# Patient Record
Sex: Male | Born: 2005 | Race: White | Hispanic: No | Marital: Single
Health system: Southern US, Community
[De-identification: ages and names within clinical notes are randomized; demographics above are authoritative.]

## PROBLEM LIST (undated history)

## (undated) DIAGNOSIS — Z789 Other specified health status: Secondary | ICD-10-CM

## (undated) HISTORY — PX: TYMPANOSTOMY TUBE PLACEMENT: SHX32

---
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
## 2024-03-16 ENCOUNTER — Emergency Department
Admission: EM | Admit: 2024-03-16 | Discharge: 2024-03-16 | Disposition: A | Discharge status: Home / Self Care | Payer: Self-pay | Attending: Emergency Medicine | Admitting: Emergency Medicine

## 2024-03-16 ENCOUNTER — Other Ambulatory Visit: Payer: Self-pay

## 2024-03-16 DIAGNOSIS — F1090 Alcohol use, unspecified, uncomplicated: Secondary | ICD-10-CM | POA: Diagnosis not present

## 2024-03-16 DIAGNOSIS — R1013 Epigastric pain: Secondary | ICD-10-CM | POA: Insufficient documentation

## 2024-03-16 LAB — COMPREHENSIVE METABOLIC PANEL WITH GFR
ALT: 25 U/L (ref 0–44)
AST: 24 U/L (ref 15–41)
Albumin: 4.5 g/dL (ref 3.5–5.0)
Alkaline Phosphatase: 108 U/L (ref 38–126)
Anion gap: 8 (ref 5–15)
BUN: 13 mg/dL (ref 6–20)
CO2: 25 mmol/L (ref 22–32)
Calcium: 9.5 mg/dL (ref 8.9–10.3)
Chloride: 105 mmol/L (ref 98–111)
Creatinine, Ser: 0.85 mg/dL (ref 0.61–1.24)
GFR, Estimated: >60 mL/min (ref >60)
Glucose, Bld: 84 mg/dL (ref 70–99)
Potassium: 3.6 mmol/L (ref 3.5–5.1)
Sodium: 138 mmol/L (ref 135–145)
Total Bilirubin: 0.7 mg/dL (ref 0.0–1.2)
Total Protein: 7.5 g/dL (ref 6.5–8.1)

## 2024-03-16 LAB — CBC
HCT: 42.9 % (ref 39.0–52.0)
Hemoglobin: 15.2 g/dL (ref 13.0–17.0)
MCH: 30.5 pg (ref 26.0–34.0)
MCHC: 35.4 g/dL (ref 30.0–36.0)
MCV: 86 fL (ref 80.0–100.0)
Platelets: 321 10*3/uL (ref 150–400)
RBC: 4.99 MIL/uL (ref 4.22–5.81)
RDW: 12.1 % (ref 11.5–15.5)
WBC: 9.9 10*3/uL (ref 4.0–10.5)
nRBC: 0 % (ref 0.0–0.2)

## 2024-03-16 MED ORDER — SUCRALFATE 1 G PO TABS: 1.0000 g | ORAL_TABLET | Freq: Four times a day (QID) | ORAL | 1 refills | Status: DC

## 2024-03-16 MED ORDER — PANTOPRAZOLE SODIUM 40 MG PO TBEC: 40.0000 mg | DELAYED_RELEASE_TABLET | Freq: Every day | ORAL | 1 refills | Status: DC

## 2024-03-16 NOTE — Discharge Instructions (Addendum)
 Please follow-up with GI.  Their information is attached.  I believe that your abdominal pain is coming from gastritis or an ulcer.    Please begin taking the Protonix once a day in the morning, this medication will help reduce acid over time.  You can take the Carafate before each meal and at bedtime.  This medication will help neutralize acid right when you take it.

## 2024-03-16 NOTE — ED Triage Notes (Signed)
 Brought from [facility]. C/o abdominal pain with black/coffee ground stools X3 days. Denies pain at this time.

## 2024-03-16 NOTE — ED Notes (Signed)
 Pt verbalizes understanding of discharge instructions. Opportunity for questioning and answers were provided. Pt discharged from ED to home with family.

## 2024-03-16 NOTE — ED Triage Notes (Signed)
 Patient states intermittent abdominal pain and dark stools

## 2024-03-16 NOTE — ED Provider Notes (Signed)
 [facility] Provider Note    Event Date/Time   First MD Initiated Contact with Patient 03/16/24 1543     (approximate)   History   GI Problem   HPI  Albert Jimenez is a 18 y.o. male with no PMH who presents for evaluation of dark tarry colored stools for 3 days.  He has had intermittent abdominal pain in the epigastric region.  Has not coughed up or vomited any blood.  No NSAID, aspirin or iron supplement use.  Minimal alcohol use.  No recent vomiting or coughing episodes.      Physical Exam   Triage Vital Signs: ED Triage Vitals  Encounter Vitals Group     BP 03/16/24 1341 134/79     Girls Systolic BP Percentile --      Girls Diastolic BP Percentile --      Boys Systolic BP Percentile --      Boys Diastolic BP Percentile --      Pulse Rate 03/16/24 1341 79     Resp 03/16/24 1341 18     Temp 03/16/24 1341 98.6 F (37 C)     Temp Source 03/16/24 1341 Oral     SpO2 03/16/24 1341 98 %     Weight 03/16/24 1342 190 lb (86.2 kg)     Height 03/16/24 1342 6' 2 (1.88 m)     Head Circumference --      Peak Flow --      Pain Score 03/16/24 1342 0     Pain Loc --      Pain Education --      Exclude from Growth Chart --     Most recent vital signs: Vitals:   03/16/24 1341  BP: 134/79  Pulse: 79  Resp: 18  Temp: 98.6 F (37 C)  SpO2: 98%   General: Awake, no distress.  CV:  Good peripheral perfusion.  RRR. Resp:  Normal effort.  CTAB Abd:  No distention.  Soft, no tenderness to palpation Other:  No pallor, jaundice petechiae or purpura.   ED Results / Procedures / Treatments   Labs (all labs ordered are listed, but only abnormal results are displayed) Labs Reviewed  COMPREHENSIVE METABOLIC PANEL WITH GFR  CBC  POC OCCULT BLOOD, ED    PROCEDURES:  Critical Care performed: No  Procedures   MEDICATIONS ORDERED IN ED: Medications - No data to display   IMPRESSION / MDM / ASSESSMENT AND PLAN / ED COURSE  I reviewed the  triage vital signs and the nursing notes.                             18 year old male presents for evaluation of melena.  Vital signs are stable patient NAD on exam.  Differential diagnosis includes, but is not limited to, upper GI bleed due to gastritis or peptic ulcer, less likely Mallory-Weiss tear, esophageal varices.  Patient's presentation is most consistent with acute complicated illness / injury requiring diagnostic workup.  CBC and CMP both unremarkable.  H&H is stable.  Discussed doing a rectal exam with patient to check for Hemoccult.  Patient declined rectal exam but was able to give a stool sample.  Stool was not melena.  Hemoccult was positive.  Explained to patient's family and to the patient that this test commonly has false positives.  Given that his H&H is stable, he did not have any tenderness on exam, he is overall well-appearing  do not feel that he needed admission to the hospital today.  Recommended that he follow-up with GI for further evaluation of his abdominal pain.  Will start him on a PPI and will also prescribe an antiacid.  Patient voiced understanding, all questions were answered and he was stable at discharge.      FINAL CLINICAL IMPRESSION(S) / ED DIAGNOSES   Final diagnoses:  Epigastric pain     Rx / DC Orders   ED Discharge Orders          Ordered    pantoprazole (PROTONIX) 40 MG tablet  Daily        03/16/24 1652    sucralfate (CARAFATE) 1 g tablet  4 times daily        03/16/24 1652             Note:  This document was prepared using Dragon voice recognition software and may include unintentional dictation errors.   Phyliss Breen, PA-C 03/16/24 1656    Lubertha Rush, MD 03/16/24 [phone]

## 2024-09-18 ENCOUNTER — Other Ambulatory Visit: Payer: Self-pay

## 2024-09-18 ENCOUNTER — Emergency Department

## 2024-09-18 ENCOUNTER — Inpatient Hospital Stay
Admission: EM | Admit: 2024-09-18 | Discharge: 2024-09-24 | Disposition: A | Discharge status: Home / Self Care | Attending: Internal Medicine | Admitting: Internal Medicine

## 2024-09-18 DIAGNOSIS — X509XXA Other and unspecified overexertion or strenuous movements or postures, initial encounter: Secondary | ICD-10-CM

## 2024-09-18 DIAGNOSIS — Z79899 Other long term (current) drug therapy: Secondary | ICD-10-CM

## 2024-09-18 DIAGNOSIS — R52 Pain, unspecified: Secondary | ICD-10-CM

## 2024-09-18 DIAGNOSIS — E876 Hypokalemia: Secondary | ICD-10-CM | POA: Diagnosis present

## 2024-09-18 DIAGNOSIS — M25511 Pain in right shoulder: Principal | ICD-10-CM | POA: Diagnosis present

## 2024-09-18 DIAGNOSIS — R7401 Elevation of levels of liver transaminase levels: Secondary | ICD-10-CM | POA: Diagnosis present

## 2024-09-18 DIAGNOSIS — R748 Abnormal levels of other serum enzymes: Secondary | ICD-10-CM | POA: Diagnosis present

## 2024-09-18 DIAGNOSIS — M75111 Incomplete rotator cuff tear or rupture of right shoulder, not specified as traumatic: Secondary | ICD-10-CM | POA: Diagnosis present

## 2024-09-18 DIAGNOSIS — F17290 Nicotine dependence, other tobacco product, uncomplicated: Secondary | ICD-10-CM | POA: Diagnosis present

## 2024-09-18 DIAGNOSIS — T796XXA Traumatic ischemia of muscle, initial encounter: Principal | ICD-10-CM | POA: Diagnosis present

## 2024-09-18 DIAGNOSIS — M6282 Rhabdomyolysis: Secondary | ICD-10-CM | POA: Insufficient documentation

## 2024-09-18 DIAGNOSIS — R7989 Other specified abnormal findings of blood chemistry: Secondary | ICD-10-CM | POA: Diagnosis present

## 2024-09-18 LAB — BASIC METABOLIC PANEL WITH GFR
Anion gap: 10 (ref 5–15)
BUN: 17 mg/dL (ref 6–20)
CO2: 23 mmol/L (ref 22–32)
Calcium: 9.1 mg/dL (ref 8.9–10.3)
Chloride: 103 mmol/L (ref 98–111)
Creatinine, Ser: 0.69 mg/dL (ref 0.61–1.24)
GFR, Estimated: >60 mL/min
Glucose, Bld: 95 mg/dL (ref 70–99)
Potassium: 3.8 mmol/L (ref 3.5–5.1)
Sodium: 136 mmol/L (ref 135–145)

## 2024-09-18 LAB — URINALYSIS, ROUTINE W REFLEX MICROSCOPIC
Bilirubin Urine: NEGATIVE
Glucose, UA: NEGATIVE mg/dL
Hgb urine dipstick: NEGATIVE
Ketones, ur: NEGATIVE mg/dL
Leukocytes,Ua: NEGATIVE
Nitrite: NEGATIVE
Protein, ur: NEGATIVE mg/dL
Specific Gravity, Urine: 1.017 (ref 1.005–1.030)
pH: 6 (ref 5.0–8.0)

## 2024-09-18 LAB — CBC WITH DIFFERENTIAL/PLATELET
Abs Immature Granulocytes: 0.04 K/uL (ref 0.00–0.07)
Basophils Absolute: 0 K/uL (ref 0.0–0.1)
Basophils Relative: 0 %
Eosinophils Absolute: 0 K/uL (ref 0.0–0.5)
Eosinophils Relative: 0 %
HCT: 42.7 % (ref 39.0–52.0)
Hemoglobin: 14.9 g/dL (ref 13.0–17.0)
Immature Granulocytes: 0 %
Lymphocytes Relative: 10 %
Lymphs Abs: 1.1 K/uL (ref 0.7–4.0)
MCH: 29.9 pg (ref 26.0–34.0)
MCHC: 34.9 g/dL (ref 30.0–36.0)
MCV: 85.7 fL (ref 80.0–100.0)
Monocytes Absolute: 0.6 K/uL (ref 0.1–1.0)
Monocytes Relative: 6 %
Neutro Abs: 8.6 K/uL — ABNORMAL HIGH (ref 1.7–7.7)
Neutrophils Relative %: 84 %
Platelets: 259 K/uL (ref 150–400)
RBC: 4.98 MIL/uL (ref 4.22–5.81)
RDW: 12.1 % (ref 11.5–15.5)
WBC: 10.4 K/uL (ref 4.0–10.5)
nRBC: 0 % (ref 0.0–0.2)

## 2024-09-18 LAB — MAGNESIUM: Magnesium: 2 mg/dL (ref 1.7–2.4)

## 2024-09-18 LAB — CK
Total CK: 6339 U/L — ABNORMAL HIGH (ref 49–397)
Total CK: 6047 U/L — ABNORMAL HIGH (ref 49–397)

## 2024-09-18 MED ORDER — HYDROMORPHONE HCL 1 MG/ML IJ SOLN
1.0000 mg | Freq: Once | INTRAMUSCULAR | Status: AC
  Administered 2024-09-18: 1 mg via INTRAVENOUS
  Filled 2024-09-18: qty 1

## 2024-09-18 MED ORDER — ACETAMINOPHEN 325 MG PO TABS
650.0000 mg | ORAL_TABLET | Freq: Once | ORAL | Status: AC
  Administered 2024-09-18: 650 mg via ORAL
  Filled 2024-09-18: qty 2

## 2024-09-18 MED ORDER — KETOROLAC TROMETHAMINE 15 MG/ML IJ SOLN
15.0000 mg | Freq: Once | INTRAMUSCULAR | Status: AC
  Administered 2024-09-18: 15 mg via INTRAMUSCULAR
  Filled 2024-09-18: qty 1

## 2024-09-18 MED ORDER — SENNOSIDES-DOCUSATE SODIUM 8.6-50 MG PO TABS: 1.0000 | ORAL_TABLET | Freq: Every evening | ORAL | Status: DC | PRN

## 2024-09-18 MED ORDER — ACETAMINOPHEN 325 MG PO TABS: 650.0000 mg | ORAL_TABLET | Freq: Four times a day (QID) | ORAL | Status: DC | PRN

## 2024-09-18 MED ORDER — MORPHINE SULFATE (PF) 2 MG/ML IV SOLN
2.0000 mg | Freq: Once | INTRAVENOUS | Status: AC
  Administered 2024-09-18: 2 mg via INTRAVENOUS
  Filled 2024-09-18: qty 1

## 2024-09-18 MED ORDER — HYDROMORPHONE HCL 1 MG/ML IJ SOLN: 1.0000 mg | INTRAMUSCULAR | Status: DC | PRN

## 2024-09-18 MED ORDER — ACETAMINOPHEN 500 MG PO TABS: 1000.0000 mg | ORAL_TABLET | Freq: Four times a day (QID) | ORAL | Status: DC | PRN

## 2024-09-18 MED ORDER — METHOCARBAMOL 500 MG PO TABS
500.0000 mg | ORAL_TABLET | Freq: Four times a day (QID) | ORAL | Status: DC
  Administered 2024-09-18 – 2024-09-21 (×14): 500 mg via ORAL
  Filled 2024-09-18 (×17): qty 1

## 2024-09-18 MED ORDER — SODIUM CHLORIDE 0.9 % IV BOLUS
1000.0000 mL | Freq: Once | INTRAVENOUS | Status: AC
  Administered 2024-09-18: 1000 mL via INTRAVENOUS

## 2024-09-18 MED ORDER — ACETAMINOPHEN 500 MG PO TABS
1000.0000 mg | ORAL_TABLET | Freq: Four times a day (QID) | ORAL | Status: DC
  Administered 2024-09-18 – 2024-09-23 (×14): 1000 mg via ORAL
  Filled 2024-09-18 (×17): qty 2

## 2024-09-18 MED ORDER — SODIUM CHLORIDE 0.9% FLUSH
3.0000 mL | Freq: Two times a day (BID) | INTRAVENOUS | Status: DC
  Administered 2024-09-18 – 2024-09-23 (×7): 3 mL via INTRAVENOUS

## 2024-09-18 MED ORDER — ACETAMINOPHEN 325 MG RE SUPP: 650.0000 mg | Freq: Four times a day (QID) | RECTAL | Status: DC | PRN

## 2024-09-18 MED ORDER — HEPARIN SODIUM (PORCINE) 5000 UNIT/ML IJ SOLN
5000.0000 [IU] | Freq: Three times a day (TID) | INTRAMUSCULAR | Status: DC
  Administered 2024-09-18 – 2024-09-22 (×11): 5000 [IU] via SUBCUTANEOUS
  Filled 2024-09-18 (×11): qty 1

## 2024-09-18 MED ORDER — GADOBUTROL 1 MMOL/ML IV SOLN
9.0000 mL | Freq: Once | INTRAVENOUS | Status: AC | PRN
  Administered 2024-09-18: 9 mL via INTRAVENOUS

## 2024-09-18 MED ORDER — KETOROLAC TROMETHAMINE 15 MG/ML IJ SOLN
15.0000 mg | Freq: Four times a day (QID) | INTRAMUSCULAR | Status: DC
  Administered 2024-09-18 – 2024-09-20 (×7): 15 mg via INTRAVENOUS
  Filled 2024-09-18 (×7): qty 1

## 2024-09-18 MED ORDER — LACTATED RINGERS IV BOLUS
1000.0000 mL | Freq: Once | INTRAVENOUS | Status: AC
  Administered 2024-09-18: 1000 mL via INTRAVENOUS

## 2024-09-18 MED ORDER — ONDANSETRON HCL 4 MG PO TABS: 4.0000 mg | ORAL_TABLET | Freq: Four times a day (QID) | ORAL | Status: DC | PRN

## 2024-09-18 MED ORDER — LACTATED RINGERS IV SOLN: INTRAVENOUS | Status: DC

## 2024-09-18 MED ORDER — ONDANSETRON HCL 4 MG/2ML IJ SOLN: 4.0000 mg | Freq: Four times a day (QID) | INTRAMUSCULAR | Status: DC | PRN

## 2024-09-18 MED ORDER — OXYCODONE HCL 5 MG PO TABS
5.0000 mg | ORAL_TABLET | ORAL | Status: DC | PRN
  Administered 2024-09-18: 5 mg via ORAL
  Filled 2024-09-18: qty 1

## 2024-09-18 NOTE — ED Provider Notes (Signed)
 "  [facility] Provider Note    Event Date/Time   First MD Initiated Contact with Patient 09/18/24 0825     (approximate)   History   Shoulder Injury   HPI  Albert Jimenez is a 18 y.o. male who presents today for evaluation of right shoulder pain.  Patient reports that he recently started working out again and was lifting heavy weights last night.  He felt a sharp pull in his right shoulder at the time but did not think too much of it and continued throughout the rest of his evening.  He reports that the pain awoke him from sleep at approximately 3 AM.  He reports that he has pain with any movement of the shoulder now.  He reports that the pain is primarily on the lateral aspect of his shoulder.  Does not radiate down to his fingers or into his chest or neck.  No fevers or chills.  Denies any dark urine.  There are no active problems to display for this patient.         Physical Exam   Triage Vital Signs: ED Triage Vitals  Encounter Vitals Group     BP 09/18/24 0744 120/64     Girls Systolic BP Percentile --      Girls Diastolic BP Percentile --      Boys Systolic BP Percentile --      Boys Diastolic BP Percentile --      Pulse Rate 09/18/24 0744 70     Resp 09/18/24 0744 20     Temp 09/18/24 0744 98.1 F (36.7 C)     Temp Source 09/18/24 0744 Oral     SpO2 09/18/24 0744 98 %     Weight 09/18/24 0743 200 lb (90.7 kg)     Height 09/18/24 0743 6' 2 (1.88 m)     Head Circumference --      Peak Flow --      Pain Score 09/18/24 0742 10     Pain Loc --      Pain Education --      Exclude from Growth Chart --     Most recent vital signs: Vitals:   09/18/24 0744 09/18/24 1239  BP: 120/64 (!) 124/58  Pulse: 70 68  Resp: 20 17  Temp: 98.1 F (36.7 C) 98 F (36.7 C)  SpO2: 98% 99%    Physical Exam Vitals and nursing note reviewed.  Constitutional:      General: Awake and alert. No acute distress.    Appearance: Normal appearance. The  patient is normal weight.  HENT:     Head: Normocephalic and atraumatic.     Mouth: Mucous membranes are moist.  Eyes:     General: PERRL. Normal EOMs        Right eye: No discharge.        Left eye: No discharge.     Conjunctiva/sclera: Conjunctivae normal.  Cardiovascular:     Rate and Rhythm: Normal rate and regular rhythm.     Pulses: Normal pulses.  Pulmonary:     Effort: Pulmonary effort is normal. No respiratory distress.     Breath sounds: Normal breath sounds.  Abdominal:     Abdomen is soft. There is no abdominal tenderness. No rebound or guarding. No distention. Musculoskeletal:        General: No swelling. Normal range of motion.     Cervical back: Normal range of motion and neck supple.  No cervical spine  tenderness. Right shoulder: No obvious deformity, swelling, ecchymosis, or erythema.  No warmth or wounds noted. No clavicular or AC joint tenderness.  Tenderness to lateral shoulder joint line.  No ecchymosis noted. Limited range of motion with active and passive range of motion at the shoulder secondary to pain, though able to range it to approximately 20 degrees with abduction and forward flexion, negative drop arm test Pain with attempted Obriens, SLAP, empty can, and lift off tests Normal ROM at elbow and wrist without tenderness to palpation 2+ radial pulse Normal grip strength Normal intrinsic hand muscle function Skin:    General: Skin is warm and dry.     Capillary Refill: Capillary refill takes less than 2 seconds.     Findings: No rash.  Neurological:     Mental Status: The patient is awake and alert.      ED Results / Procedures / Treatments   Labs (all labs ordered are listed, but only abnormal results are displayed) Labs Reviewed  CBC WITH DIFFERENTIAL/PLATELET - Abnormal; Notable for the following components:      Result Value   Neutro Abs 8.6 (*)    All other components within normal limits  CK - Abnormal; Notable for the following  components:   Total CK 6,339 (*)    All other components within normal limits  CK - Abnormal; Notable for the following components:   Total CK 6,047 (*)    All other components within normal limits  BASIC METABOLIC PANEL WITH GFR     EKG     RADIOLOGY I independently reviewed and interpreted imaging and agree with radiologists findings.     PROCEDURES:  Critical Care performed:   Procedures   MEDICATIONS ORDERED IN ED: Medications  morphine  (PF) 2 MG/ML injection 2 mg (has no administration in time range)  ketorolac  (TORADOL ) 15 MG/ML injection 15 mg (15 mg Intramuscular Given 09/18/24 0957)  sodium chloride  0.9 % bolus 1,000 mL (0 mLs Intravenous Stopped 09/18/24 1130)  sodium chloride  0.9 % bolus 1,000 mL (0 mLs Intravenous Stopped 09/18/24 1300)  acetaminophen  (TYLENOL ) tablet 650 mg (650 mg Oral Given 09/18/24 1242)  morphine  (PF) 2 MG/ML injection 2 mg (2 mg Intravenous Given 09/18/24 1242)  morphine  (PF) 2 MG/ML injection 2 mg (2 mg Intravenous Given 09/18/24 1340)     IMPRESSION / MDM / ASSESSMENT AND PLAN / ED COURSE  I reviewed the triage vital signs and the nursing notes.   Differential diagnosis includes, but is not limited to, musculoskeletal injury, fracture, dislocation, rhabdomyolysis  Patient is awake and alert, hemodynamically stable and afebrile.  X-ray obtained in triage is negative for any acute osseous injury.  There is no obvious deformity on exam and he has normal radial pulse, normal grip strength.  Recommended further workup with blood testing for evaluation of rhabdomyolysis.  CK was elevated to 6339.  No creatinine elevation. He was hydrated with normal saline and plan to recheck this.  In the meantime pain control with Toradol  with good effect  CK is downtrending.  Patient was reassessed multiple times, and his pain appears to be worsening.  He was treated symptomatically with morphine  with good effect.  He has normal radial pulse,  sensation is intact over the deltoid.  He is neurovascularly intact.  Compartments are soft and compressible throughout.  There is no warmth or erythema or constitutional symptoms to suggest septic shoulder.  However, his pain does seem to be out of proportion to the story, therefore we will  obtain MRI for further evaluation.  Family is in agreement with this plan as this patient.  Patient was passed off to Devere Perry, PA-C, pending MRI results and final disposition.  Patient's presentation is most consistent with acute presentation with potential threat to life or bodily function.  Clinical Course as of 09/18/24 1513  Sun Sep 18, 2024  1235 Patient is requesting pain medication [JP]  1308 Went to reassess patient, patient reports that approximately 20 minutes ago his pain became much more severe.  He was given morphine  which he reports has barely touched him.  He is holding his arm tightly against his chest.  Has pain with any attempted movement of his shoulder.  Will obtain MRI [JP]  1440 Patient reports that he feels better though pain still persists [JP]  1504 Patient passed off to S. Perry, PA-C pending MRI and final disposition [JP]    Clinical Course User Index [JP] Deandra Goering E, PA-C     FINAL CLINICAL IMPRESSION(S) / ED DIAGNOSES   Final diagnoses:  Acute pain of right shoulder  Elevated CK     Rx / DC Orders   ED Discharge Orders     None        Note:  This document was prepared using Dragon voice recognition software and may include unintentional dictation errors.   Deny Chevez E, PA-C 09/18/24 1513  "

## 2024-09-18 NOTE — H&P (Addendum)
 " History and Physical    BRODRICK CURRAN FMW:[id number] DOB: 2006-02-18 DOA: 09/18/2024  DOS: the patient was seen and examined on 09/18/2024  PCP: Practice, [facility]   Patient coming from: Home  I have personally briefly reviewed patient's old medical records in [facility] and [facility]where  HPI:   Albert Jimenez is a 18 y.o. year old male without any significant medical history presenting to the ED with right shoulder pain.  He states he was working out yesterday and had overextension when he was performing chest flies.  He initially did not have any significant pain but woke up in pain at 3 AM in the morning.  Describes the pain as sharp, severe.  States he has significant pain with any movement in the right arm.  He states range of motion is limited by pain.  He denies any fevers or chills any URI or other infectious symptoms.   On arrival to the ED patient was noted to be HDS stable.  Lab work and imaging obtained.  CBC without leukocytosis or anemia.  BMP unremarkable.  CK elevated at 6339 and patient received IV fluid and repeat CK downtrending close 6047.  UA pending.  Patient with significant shoulder pain, given this and elevated CK, TRH contacted for admission.  Review of Systems: As mentioned in the history of present illness. All other systems reviewed and are negative.   Past Medical History:  Diagnosis Date   Medical history non-contributory     Past Surgical History:  Procedure Laterality Date   CLOSED REDUCTION WRIST FRACTURE Left 07/02/2016   Procedure: CLOSED REDUCTION LEFT FOREARM;  Surgeon: Norleen JINNY Maltos, MD;  [facility];  Service: Orthopedics;  Laterality: Left;   TYMPANOSTOMY TUBE PLACEMENT     as young child     Allergies[1]  History reviewed. No pertinent family history.  Medications: Patient not taking any medicines.  Social History:  reports that he is a non-smoker but has been exposed to tobacco smoke. He has  never used smokeless tobacco. He reports that he does not drink alcohol and does not use drugs.  Patient works for [employer].  Vapes and drinks alcohol on special occasions.  Independent in IADLs and ADLs  Physical Exam: Vitals:   09/18/24 0743 09/18/24 0744 09/18/24 1239  BP:  120/64 (!) 124/58  Pulse:  70 68  Resp:  20 17  Temp:  98.1 F (36.7 C) 98 F (36.7 C)  TempSrc:  Oral Oral  SpO2:  98% 99%  Weight: 90.7 kg    Height: 6' 2 (1.88 m)      Gen: NAD HENT: NCAT CV: normal heart sounds Lung: CTAB Abd: No TTP, normal bowel sounds MSK: No asymmetry, good bulk and tone.  Patient deferred right shoulder testing due to pain.  Right upper extremity is neurovascularly intact. Neuro: alert and oriented   Labs on Admission: I have personally reviewed following labs and imaging studies  CBC: Recent Labs  Lab 09/18/24 0921  WBC 10.4  NEUTROABS 8.6*  HGB 14.9  HCT 42.7  MCV 85.7  PLT 259   Basic Metabolic Panel: Recent Labs  Lab 09/18/24 0921  NA 136  K 3.8  CL 103  CO2 23  GLUCOSE 95  BUN 17  CREATININE 0.69  CALCIUM 9.1   GFR: Estimated Creatinine Clearance: 171.3 mL/min (by C-G formula based on SCr of 0.69 mg/dL). Liver Function Tests: No results for input(s): AST, ALT, ALKPHOS, BILITOT, PROT, ALBUMIN in  the last 168 hours. No results for input(s): LIPASE, AMYLASE in the last 168 hours. No results for input(s): AMMONIA in the last 168 hours. Coagulation Profile: No results for input(s): INR, PROTIME in the last 168 hours. Cardiac Enzymes: Recent Labs  Lab 09/18/24 0921 09/18/24 1305  CKTOTAL 6,339* 6,047*   BNP (last 3 results) No results for input(s): BNP in the last 8760 hours. HbA1C: No results for input(s): HGBA1C in the last 72 hours. CBG: No results for input(s): GLUCAP in the last 168 hours. Lipid Profile: No results for input(s): CHOL, HDL, LDLCALC, TRIG, CHOLHDL, LDLDIRECT in the last 72 hours. Thyroid  Function Tests: No results for input(s): TSH, T4TOTAL, FREET4, T3FREE, THYROIDAB in the last 72 hours. Anemia Panel: No results for input(s): VITAMINB12, FOLATE, FERRITIN, TIBC, IRON, RETICCTPCT in the last 72 hours. Urine analysis: No results found for: COLORURINE, APPEARANCEUR, LABSPEC, PHURINE, GLUCOSEU, HGBUR, BILIRUBINUR, KETONESUR, PROTEINUR, UROBILINOGEN, NITRITE, LEUKOCYTESUR  Radiological Exams on Admission: I have personally reviewed images DG Shoulder Right Result Date: 09/18/2024 EXAM: 1 VIEW(S) XRAY OF THE RIGHT SHOULDER 09/18/2024 08:00:00 AM COMPARISON: None available. CLINICAL HISTORY: shoulder injury FINDINGS: BONES AND JOINTS: Glenohumeral joint is normally aligned. No acute fracture. No malalignment. [facility] joint is unremarkable. SOFT TISSUES: No abnormal calcifications. Visualized lung is unremarkable. IMPRESSION: 1. No acute fracture or dislocation. Electronically signed by: Waddell Calk MD 09/18/2024 08:12 AM EST RP Workstation: [id number]    EKG: My personal interpretation of EKG shows: pending    Assessment/Plan Principal Problem:   Right shoulder pain Active Problems:   Elevated CK  Patient with right shoulder pain after traumatic injury leading to mild to moderate rhabdomyolysis.  Currently biggest issue is pain control as patient reports severe pain in the right shoulder.  Plain radiography of the right shoulder negative for acute fractures but MRI pending. Pt primarily admitted for pain control. Will start multimodal pain control. Started on oxycodone  5 mg q6hrs prn. IV Dilaudid  1 mg q2hrs for breakthrough pain. Scheduled tylenol  at 1000 mg q6hrs. Toradol  15 mg q6hrs for 5 doses. Normal renal function and no hx of PUD noted. Started IV robaxin  500 mg q4hrs.   Elevated CK: Pt found to have mild to moderate rhabdomyolysis 2/2 to shoulder injury. CK elevated at 6k. . Started pt on IVF. Will trend CK. UA ordered. Renal  function normal.    VTE prophylaxis:  SQ Heparin   Diet: Regular Code Status:  Full Code Telemetry:  Admission status: Observation, Med-Surg Patient is from: Home Anticipated d/c is to: Home Anticipated d/c is in: 1-2 days   Family Communication: Updated at bedside  Consults called: None   Severity of Illness: The appropriate patient status for this patient is OBSERVATION. Observation status is judged to be reasonable and necessary in order to provide the required intensity of service to ensure the patient's safety. The patient's presenting symptoms, physical exam findings, and initial radiographic and laboratory data in the context of their medical condition is felt to place them at decreased risk for further clinical deterioration. Furthermore, it is anticipated that the patient will be medically stable for discharge from the hospital within 2 midnights of admission.    Morene Bathe, MD [facility]      [1] No Known Allergies  "

## 2024-09-18 NOTE — ED Triage Notes (Signed)
 Pt to ED for R shoulder injury after working out heavily yesterday at the gym. Cannot move the R arm. Does not appear to be dislocated. Pain woke him up today around 2am.

## 2024-09-18 NOTE — ED Provider Notes (Signed)
" °  Physical Exam  BP (!) 124/58   Pulse 68   Temp 98 F (36.7 C) (Oral)   Resp 17   Ht 6' 2 (1.88 m)   Wt 90.7 kg   SpO2 99%   BMI 25.68 kg/m   Physical Exam  Procedures  Procedures  ED Course / MDM   Clinical Course as of 09/18/24 1544  Sun Sep 18, 2024  1235 Patient is requesting pain medication [JP]  1308 Went to reassess patient, patient reports that approximately 20 minutes ago his pain became much more severe.  He was given morphine  which he reports has barely touched him.  He is holding his arm tightly against his chest.  Has pain with any attempted movement of his shoulder.  Will obtain MRI [JP]  1440 Patient reports that he feels better though pain still persists [JP]  1504 Patient passed off to S. Gasper, PA-C pending MRI and final disposition [JP]    Clinical Course User Index [JP] Poggi, Jenna E, PA-C   Medical Decision Making Amount and/or Complexity of Data Reviewed Labs: ordered. Radiology: ordered.  Risk OTC drugs. Prescription drug management. Decision regarding hospitalization.   Assuming care of the patient from outgoing provider.  At this time we are to await MRI results, assess patient's pain, patient has downtrending CK levels due to rhabdomyolysis.  Patient continues to have severe pain.  Not comfortable sending him home with a CK over 6000 along with continued pain in the shoulder and body.  I do feel that it would benefit him to continue IV fluids overnight and be reassessed in 24 to 48 hours as to whether he is downtrending enough to do outpatient therapy.  The patient and his mother are both in agreement with this treatment plan.  Consult hospitalist for admission Dr. Nelida to admit the patient.  He was given additional dose pain medication, Dilaudid  for pain control.    Gasper Devere ORN, PA-C 09/18/24 1730    Waymond Lorelle Cummins, MD 09/18/24 1752  "

## 2024-09-19 DIAGNOSIS — E876 Hypokalemia: Secondary | ICD-10-CM | POA: Diagnosis present

## 2024-09-19 DIAGNOSIS — R748 Abnormal levels of other serum enzymes: Secondary | ICD-10-CM | POA: Diagnosis present

## 2024-09-19 DIAGNOSIS — T796XXA Traumatic ischemia of muscle, initial encounter: Secondary | ICD-10-CM | POA: Diagnosis present

## 2024-09-19 DIAGNOSIS — R7989 Other specified abnormal findings of blood chemistry: Secondary | ICD-10-CM | POA: Diagnosis present

## 2024-09-19 DIAGNOSIS — T796XXD Traumatic ischemia of muscle, subsequent encounter: Secondary | ICD-10-CM | POA: Diagnosis not present

## 2024-09-19 DIAGNOSIS — M25511 Pain in right shoulder: Secondary | ICD-10-CM | POA: Diagnosis present

## 2024-09-19 DIAGNOSIS — M75111 Incomplete rotator cuff tear or rupture of right shoulder, not specified as traumatic: Secondary | ICD-10-CM | POA: Diagnosis present

## 2024-09-19 DIAGNOSIS — Z79899 Other long term (current) drug therapy: Secondary | ICD-10-CM | POA: Diagnosis not present

## 2024-09-19 DIAGNOSIS — F17290 Nicotine dependence, other tobacco product, uncomplicated: Secondary | ICD-10-CM | POA: Diagnosis present

## 2024-09-19 DIAGNOSIS — R7401 Elevation of levels of liver transaminase levels: Secondary | ICD-10-CM | POA: Diagnosis present

## 2024-09-19 DIAGNOSIS — X509XXA Other and unspecified overexertion or strenuous movements or postures, initial encounter: Secondary | ICD-10-CM | POA: Diagnosis not present

## 2024-09-19 LAB — BASIC METABOLIC PANEL WITH GFR
Anion gap: 9 (ref 5–15)
BUN: 8 mg/dL (ref 6–20)
CO2: 24 mmol/L (ref 22–32)
Calcium: 8.7 mg/dL — ABNORMAL LOW (ref 8.9–10.3)
Chloride: 106 mmol/L (ref 98–111)
Creatinine, Ser: 0.63 mg/dL (ref 0.61–1.24)
GFR, Estimated: >60 mL/min
Glucose, Bld: 89 mg/dL (ref 70–99)
Potassium: 3.9 mmol/L (ref 3.5–5.1)
Sodium: 140 mmol/L (ref 135–145)

## 2024-09-19 LAB — CBC
HCT: 39.6 % (ref 39.0–52.0)
Hemoglobin: 13.9 g/dL (ref 13.0–17.0)
MCH: 30.3 pg (ref 26.0–34.0)
MCHC: 35.1 g/dL (ref 30.0–36.0)
MCV: 86.3 fL (ref 80.0–100.0)
Platelets: 260 K/uL (ref 150–400)
RBC: 4.59 MIL/uL (ref 4.22–5.81)
RDW: 12.2 % (ref 11.5–15.5)
WBC: 6.2 K/uL (ref 4.0–10.5)
nRBC: 0 % (ref 0.0–0.2)

## 2024-09-19 LAB — HIV ANTIBODY (ROUTINE TESTING W REFLEX): HIV Screen 4th Generation wRfx: NONREACTIVE

## 2024-09-19 LAB — CK: Total CK: 7914 U/L — ABNORMAL HIGH (ref 49–397)

## 2024-09-19 MED ORDER — LACTATED RINGERS IV SOLN: INTRAVENOUS | Status: AC

## 2024-09-19 NOTE — Consult Note (Signed)
 "                                                                ORTHOPAEDIC CONSULTATION  REQUESTING PHYSICIAN: Lanetta Lingo, MD  Chief Complaint:   Right shoulder pain  History of Present Illness: Albert Jimenez is a 18 y.o. male who presented to the emergency department last night for right shoulder pain.  The patient recently started working out again and was lifting heavy weights.  He felt a sharp pull in his right shoulder but it was not overly bothersome to him.  However, the pain eventually woke him from sleep around 3 AM and so he presented to the ER for evaluation.  He has been having fairly significant pain with any movements of the shoulder.  The pain is primarily in the lateral aspect of the shoulder and does not radiate into the fingers, chest or neck.  However, the patient was found to be in rhabdomyolysis and so has been admitted to the hospitalist service for management of this.  MRI of the right shoulder demonstrated a low-grade partial tear of the supraspinatus tendon at the myotendinous junction with mild supraspinatus tendinosis.  Orthopedics has been consulted for this injury.  He reports that his shoulder pain is improving since last night.  He says that it would have been very sensitive to palpation but now he can tolerate the shoulder being touched and moved.  He says that he is improving but continues to have some shoulder pain.  Past Medical History:  Diagnosis Date   Medical history non-contributory    Past Surgical History:  Procedure Laterality Date   CLOSED REDUCTION WRIST FRACTURE Left 07/02/2016   Procedure: CLOSED REDUCTION LEFT FOREARM;  Surgeon: Norleen JINNY Maltos, MD;  Location: [facility];  Service: Orthopedics;  Laterality: Left;   TYMPANOSTOMY TUBE PLACEMENT     as young child   Social History   Socioeconomic History   Marital status: Single    Spouse name: Not on file   Number of children: Not on file   Years of education: Not on file    Highest education level: Not on file  Occupational History   Not on file  Tobacco Use   Smoking status: Passive Smoke Exposure - Never Smoker   Smokeless tobacco: Never  Vaping Use   Vaping status: Every Day  Substance and Sexual Activity   Alcohol use: No   Drug use: No   Sexual activity: Not on file  Other Topics Concern   Not on file  Social History Narrative   Not on file   Social Drivers of Health   Tobacco Use: Medium Risk (09/18/2024)   Patient History    Smoking Tobacco Use: Passive Smoke Exposure - Never Smoker    Smokeless Tobacco Use: Never    Passive Exposure: Yes  Financial Resource Strain: Low Risk  (03/16/2024)   Received from [facility]   Overall Financial Resource Strain (CARDIA)    Difficulty of Paying Living Expenses: Not hard at all  Food Insecurity: No Food Insecurity (09/19/2024)   Epic    Worried About Running Out of Food in the Last Year: Never true    Ran Out of Food in the Last Year: Never true  Transportation  Needs: No Transportation Needs (09/19/2024)   Epic    Lack of Transportation (Medical): No    Lack of Transportation (Non-Medical): No  Physical Activity: Not on file  Stress: Not on file  Social Connections: Not on file  Depression (EYV7-0): Not on file  Alcohol Screen: Not on file  Housing: Unknown (09/19/2024)   Epic    Unable to Pay for Housing in the Last Year: No    Number of Times Moved in the Last Year: Not on file    Homeless in the Last Year: Not on file  Utilities: Not At Risk (09/19/2024)   Epic    Threatened with loss of utilities: No  Health Literacy: Not on file   History reviewed. No pertinent family history. Allergies[1] Prior to Admission medications  Medication Sig Start Date End Date Taking? Authorizing Provider  pantoprazole  (PROTONIX ) 40 MG tablet Take 1 tablet (40 mg total) by mouth daily. 03/16/24 03/16/25 Yes Dougherty, Lauren A, PA-C  sucralfate  (CARAFATE ) 1 g tablet Take 1 tablet (1 g  total) by mouth 4 (four) times daily. 03/16/24 03/16/25 Yes Cleaster Tinnie LABOR, PA-C   Recent Labs    09/18/24 9078 09/19/24 0704  WBC 10.4 6.2  HGB 14.9 13.9  HCT 42.7 39.6  PLT 259 260  K 3.8 3.9  CL 103 106  CO2 23 24  BUN 17 8  CREATININE 0.69 0.63  GLUCOSE 95 89  CALCIUM 9.1 8.7*   MR SHOULDER RIGHT W WO CONTRAST Result Date: 09/19/2024 CLINICAL DATA:  Right shoulder pain after lifting heavy weights while working out. EXAM: MRI OF THE RIGHT SHOULDER WITHOUT AND WITH CONTRAST TECHNIQUE: Multiplanar, multisequence MR imaging of the RIGHT shoulder was performed before and after the administration of intravenous contrast. CONTRAST:  9mL GADAVIST  GADOBUTROL  1 MMOL/ML IV SOLN COMPARISON:  Right shoulder radiographs dated 09/18/2024. FINDINGS: Rotator cuff: Mild supraspinatus tendinosis. Infraspinatus tendon is intact. Teres minor tendon is intact. Subscapularis tendon is intact. Muscles: There is increased T2 hyperintense signal extending along the myotendinous junction of the supraspinatus tendon from the level of the rotator cuff insertion proximally with surrounding feathery edema of the supraspinatus muscle. No muscular atrophy. The remainder of the muscles demonstrate normal signal intensity. Biceps Long Head: Intact. Acromioclavicular Joint: No significant arthropathy of the acromioclavicular joint. No subacromial/subdeltoid bursal fluid. Glenohumeral Joint: No joint effusion. No chondral defect. Labrum: Grossly intact, but evaluation is limited by lack of intraarticular fluid/contrast. Bones: No fracture or dislocation. No marrow replacing lesion. Other: No fluid collection. IMPRESSION: 1. Findings most compatible with grade 1 strain versus low-grade partial tear of the supraspinatus myotendinous junction with mild supraspinatus tendinosis. 2. No discrete labral tear. 3. No acute osseous abnormality. Electronically Signed   By: Harrietta Sherry M.D.   On: 09/19/2024 10:39   DG Shoulder  Right Result Date: 09/18/2024 EXAM: 1 VIEW(S) XRAY OF THE RIGHT SHOULDER 09/18/2024 08:00:00 AM COMPARISON: None available. CLINICAL HISTORY: shoulder injury FINDINGS: BONES AND JOINTS: Glenohumeral joint is normally aligned. No acute fracture. No malalignment. The [facility] joint is unremarkable. SOFT TISSUES: No abnormal calcifications. Visualized lung is unremarkable. IMPRESSION: 1. No acute fracture or dislocation. Electronically signed by: Waddell Calk MD 09/18/2024 08:12 AM EST RP Workstation: [id number]     Positive ROS: All other systems have been reviewed and were otherwise negative with the exception of those mentioned in the HPI and as above.  Physical Exam: BP 116/66 (BP Location: Right Arm)   Pulse 86   Temp 98.7 F (37.1 C) (  Oral)   Resp 20   Ht 6' 2 (1.88 m)   Wt 91 kg   SpO2 100%   BMI 25.76 kg/m  General:  Alert, no acute distress Psychiatric:  Patient with normal mood and affect    Orthopedic Exam:  RUE: Mild pain to palpation over the lateral aspect of the shoulder.  He has about 110 degrees of forward shoulder flexion with about 170 degrees of passive forward flexion.  He is able to demonstrate about 100 degrees of shoulder abduction actively and 130 degrees of shoulder abduction passively.  Internal right rotation to the level of the iliac crest.  Negative Hawkins impingement sign.  Weakness with empty can test.  Negative pushoff test.  Full action, extension, pronosupination of the elbow and forearm.  Full flexion extension of the wrist.  Able to make a full composite fist and extend all digits.  Sensation intact to light touch throughout.  Warm well-perfused.  Imaging:  FINDINGS: Rotator cuff: Mild supraspinatus tendinosis. Infraspinatus tendon is intact. Teres minor tendon is intact. Subscapularis tendon is intact.   Muscles: There is increased T2 hyperintense signal extending along the myotendinous junction of the supraspinatus tendon from the level of the  rotator cuff insertion proximally with surrounding feathery edema of the supraspinatus muscle. No muscular atrophy. The remainder of the muscles demonstrate normal signal intensity.   Biceps Long Head: Intact.   Acromioclavicular Joint: No significant arthropathy of the acromioclavicular joint. No subacromial/subdeltoid bursal fluid.   Glenohumeral Joint: No joint effusion. No chondral defect.   Labrum: Grossly intact, but evaluation is limited by lack of intraarticular fluid/contrast.   Bones: No fracture or dislocation. No marrow replacing lesion.   Other: No fluid collection.   IMPRESSION: 1. Findings most compatible with grade 1 strain versus low-grade partial tear of the supraspinatus myotendinous junction with mild supraspinatus tendinosis. 2. No discrete labral tear. 3. No acute osseous abnormality.  Assessment/Plan: Right shoulder low-grade tearing of the supraspinatus in the setting of rhabdomyolysis  - Weightbearing as tolerated to the right upper extremity - Patient may benefit from inpatient therapy but this can also be arranged on an outpatient basis - Range of motion exercises as tolerated - May slowly progress back to activities as tolerated as shoulder pain improves - Treatment of rhabdomyolysis as per primary - Patient may follow-up with orthopedics on an outpatient basis  Jackquline CANDIE Barrack, MD Orthopaedic Surgery [facility]     [1] No Known Allergies  "

## 2024-09-19 NOTE — Plan of Care (Signed)

## 2024-09-19 NOTE — Consult Note (Signed)
 " [facility]  CONSULT NOTE    Date: 09/19/2024                  Patient Name:  Albert Jimenez  MRN: [id number]  DOB: 30-Apr-2006  Age / Sex: 18 y.o., male         PCP: [facility]                 Service Requesting Consult: TRH                 Reason for Consult: Rhabdomyolysis            History of Present Illness: Albert Jimenez is a 18 y.o.  male with no medical history, who was admitted to [facility] on 09/18/2024 for Elevated CK [R74.8] Right shoulder pain [M25.511] Inadequate pain control [R52] Acute pain of right shoulder [M25.511] Traumatic rhabdomyolysis, initial encounter [T79.6XXA]  Patient presents to ED with right shoulder pain. He reports he was working out on Saturday, no obvious injuries during session. He went to bed and was awakened by severe pain in his shoulder. He was found to have elevated CK on ED arrival.   Right shoulder MRI shows grade 1 strain vs low grade partial tear of supraspinatus myotendinous junction. Ortho consulted. CK 6339 on ED arrival with normal renal function.    Medications: Outpatient medications: Medications Prior to Admission  Medication Sig Dispense Refill Last Dose/Taking   pantoprazole  (PROTONIX ) 40 MG tablet Take 1 tablet (40 mg total) by mouth daily. 30 tablet 1 Unknown   sucralfate  (CARAFATE ) 1 g tablet Take 1 tablet (1 g total) by mouth 4 (four) times daily. 120 tablet 1 Unknown    Current medications: Current Facility-Administered Medications  Medication Dose Route Frequency Provider Last Rate Last Admin   acetaminophen  (TYLENOL ) tablet 1,000 mg  1,000 mg Oral Q6H Khan, Ghalib, MD   1,000 mg at 09/19/24 1334   heparin  injection 5,000 Units  5,000 Units Subcutaneous Q8H Fernand Prost, MD   5,000 Units at 09/19/24 1335   ketorolac  (TORADOL ) 15 MG/ML injection 15 mg  15 mg Intravenous Q6H Khan, Ghalib, MD   15 mg at 09/19/24 1124   lactated ringers  infusion   Intravenous Continuous  Agbata, Tochukwu, MD 200 mL/hr at 09/19/24 1334 Rate Change at 09/19/24 1334   methocarbamol  (ROBAXIN ) tablet 500 mg  500 mg Oral QID Khan, Ghalib, MD   500 mg at 09/19/24 1334   ondansetron  (ZOFRAN ) tablet 4 mg  4 mg Oral Q6H PRN Fernand Prost, MD       Or   ondansetron  (ZOFRAN ) injection 4 mg  4 mg Intravenous Q6H PRN Fernand Prost, MD       oxyCODONE  (Oxy IR/ROXICODONE ) immediate release tablet 5 mg  5 mg Oral Q4H PRN Khan, Ghalib, MD   5 mg at 09/18/24 2020   senna-docusate (Senokot-S) tablet 1 tablet  1 tablet Oral QHS PRN Fernand Prost, MD       sodium chloride  flush (NS) 0.9 % injection 3 mL  3 mL Intravenous Q12H Khan, Ghalib, MD   3 mL at 09/19/24 0848      Allergies: Allergies[1]    Past Medical History: Past Medical History:  Diagnosis Date   Medical history non-contributory      Past Surgical History: Past Surgical History:  Procedure Laterality Date   CLOSED REDUCTION WRIST FRACTURE Left 07/02/2016   Procedure: CLOSED REDUCTION LEFT FOREARM;  Surgeon: Norleen JINNY Maltos, MD;  Location: [facility];  Service: Orthopedics;  Laterality: Left;   TYMPANOSTOMY TUBE PLACEMENT     as young child     Family History: History reviewed. No pertinent family history.   Social History: Social History   Socioeconomic History   Marital status: Single    Spouse name: Not on file   Number of children: Not on file   Years of education: Not on file   Highest education level: Not on file  Occupational History   Not on file  Tobacco Use   Smoking status: Passive Smoke Exposure - Never Smoker   Smokeless tobacco: Never  Vaping Use   Vaping status: Every Day  Substance and Sexual Activity   Alcohol use: No   Drug use: No   Sexual activity: Not on file  Other Topics Concern   Not on file  Social History Narrative   Not on file   Social Drivers of Health   Tobacco Use: Medium Risk (09/18/2024)   Patient History    Smoking Tobacco Use: Passive Smoke Exposure - Never  Smoker    Smokeless Tobacco Use: Never    Passive Exposure: Yes  Financial Resource Strain: Low Risk  (03/16/2024)   Received from [facility]   Overall Financial Resource Strain (CARDIA)    Difficulty of Paying Living Expenses: Not hard at all  Food Insecurity: No Food Insecurity (09/19/2024)   Epic    Worried About Running Out of Food in the Last Year: Never true    Ran Out of Food in the Last Year: Never true  Transportation Needs: No Transportation Needs (09/19/2024)   Epic    Lack of Transportation (Medical): No    Lack of Transportation (Non-Medical): No  Physical Activity: Not on file  Stress: Not on file  Social Connections: Not on file  Intimate Partner Violence: Unknown (09/19/2024)   Epic    Fear of Current or Ex-Partner: No    Emotionally Abused: No    Physically Abused: Not on file    Sexually Abused: No  Depression (PHQ2-9): Not on file  Alcohol Screen: Not on file  Housing: Unknown (09/19/2024)   Epic    Unable to Pay for Housing in the Last Year: No    Number of Times Moved in the Last Year: Not on file    Homeless in the Last Year: Not on file  Utilities: Not At Risk (09/19/2024)   Epic    Threatened with loss of utilities: No  Health Literacy: Not on file     Review of Systems: Review of Systems  Constitutional:  Negative for chills, fever and malaise/fatigue.  HENT:  Negative for congestion, sore throat and tinnitus.   Eyes:  Negative for blurred vision and redness.  Respiratory:  Negative for cough, shortness of breath and wheezing.   Cardiovascular:  Negative for chest pain, palpitations, claudication and leg swelling.  Gastrointestinal:  Negative for abdominal pain, blood in stool, diarrhea, nausea and vomiting.  Genitourinary:  Negative for flank pain, frequency and hematuria.  Musculoskeletal:  Positive for joint pain. Negative for back pain, falls and myalgias.  Skin:  Negative for rash.  Neurological:  Negative for dizziness,  weakness and headaches.  Endo/Heme/Allergies:  Does not bruise/bleed easily.  Psychiatric/Behavioral:  Negative for depression. The patient is not nervous/anxious and does not have insomnia.     Vital Signs: Blood pressure 116/66, pulse 86, temperature 98.7 F (37.1 C), temperature source Oral, resp. rate 20, height 6' 2 (1.88  m), weight 91 kg, SpO2 100%.  Weight trends: Filed Weights   09/18/24 0743 09/18/24 2140 09/19/24 0427  Weight: 90.7 kg 91 kg 91 kg    Physical Exam: General: NAD  Head: Normocephalic, atraumatic. Moist oral mucosal membranes  Eyes: Anicteric  Lungs:  Clear to auscultation  Heart: Regular rate and rhythm  Abdomen:  Soft, nontender  Extremities:  no peripheral edema.  Neurologic: Alert, oriented   Skin: No lesions        Lab results: Basic Metabolic Panel: Recent Labs  Lab 09/18/24 0921 09/18/24 1305 09/19/24 0704  NA 136  --  140  K 3.8  --  3.9  CL 103  --  106  CO2 23  --  24  GLUCOSE 95  --  89  BUN 17  --  8  CREATININE 0.69  --  0.63  CALCIUM 9.1  --  8.7*  MG  --  2.0  --     Liver Function Tests: No results for input(s): AST, ALT, ALKPHOS, BILITOT, PROT, ALBUMIN in the last 168 hours. No results for input(s): LIPASE, AMYLASE in the last 168 hours. No results for input(s): AMMONIA in the last 168 hours.  CBC: Recent Labs  Lab 09/18/24 0921 09/19/24 0704  WBC 10.4 6.2  NEUTROABS 8.6*  --   HGB 14.9 13.9  HCT 42.7 39.6  MCV 85.7 86.3  PLT 259 260    Cardiac Enzymes: Recent Labs  Lab 09/18/24 0921 09/18/24 1305 09/19/24 0704  CKTOTAL 6,339* 6,047* 7,914*    BNP: Invalid input(s): POCBNP  CBG: No results for input(s): GLUCAP in the last 168 hours.  Microbiology: No results found for this or any previous visit.  Coagulation Studies: No results for input(s): LABPROT, INR in the last 72 hours.  Urinalysis: Recent Labs    09/18/24 1923  COLORURINE YELLOW*  LABSPEC 1.017  PHURINE  6.0  GLUCOSEU NEGATIVE  HGBUR NEGATIVE  BILIRUBINUR NEGATIVE  KETONESUR NEGATIVE  PROTEINUR NEGATIVE  NITRITE NEGATIVE  LEUKOCYTESUR NEGATIVE      Imaging: MR SHOULDER RIGHT W WO CONTRAST Result Date: 09/19/2024 CLINICAL DATA:  Right shoulder pain after lifting heavy weights while working out. EXAM: MRI OF THE RIGHT SHOULDER WITHOUT AND WITH CONTRAST TECHNIQUE: Multiplanar, multisequence MR imaging of the RIGHT shoulder was performed before and after the administration of intravenous contrast. CONTRAST:  9mL GADAVIST  GADOBUTROL  1 MMOL/ML IV SOLN COMPARISON:  Right shoulder radiographs dated 09/18/2024. FINDINGS: Rotator cuff: Mild supraspinatus tendinosis. Infraspinatus tendon is intact. Teres minor tendon is intact. Subscapularis tendon is intact. Muscles: There is increased T2 hyperintense signal extending along the myotendinous junction of the supraspinatus tendon from the level of the rotator cuff insertion proximally with surrounding feathery edema of the supraspinatus muscle. No muscular atrophy. The remainder of the muscles demonstrate normal signal intensity. Biceps Long Head: Intact. Acromioclavicular Joint: No significant arthropathy of the acromioclavicular joint. No subacromial/subdeltoid bursal fluid. Glenohumeral Joint: No joint effusion. No chondral defect. Labrum: Grossly intact, but evaluation is limited by lack of intraarticular fluid/contrast. Bones: No fracture or dislocation. No marrow replacing lesion. Other: No fluid collection. IMPRESSION: 1. Findings most compatible with grade 1 strain versus low-grade partial tear of the supraspinatus myotendinous junction with mild supraspinatus tendinosis. 2. No discrete labral tear. 3. No acute osseous abnormality. Electronically Signed   By: Harrietta Sherry M.D.   On: 09/19/2024 10:39   DG Shoulder Right Result Date: 09/18/2024 EXAM: 1 VIEW(S) XRAY OF THE RIGHT SHOULDER 09/18/2024 08:00:00 AM COMPARISON: None  available. CLINICAL  HISTORY: shoulder injury FINDINGS: BONES AND JOINTS: Glenohumeral joint is normally aligned. No acute fracture. No malalignment. The [facility] joint is unremarkable. SOFT TISSUES: No abnormal calcifications. Visualized lung is unremarkable. IMPRESSION: 1. No acute fracture or dislocation. Electronically signed by: Waddell Calk MD 09/18/2024 08:12 AM EST RP Workstation: [id number]     Assessment & Plan: Mr. LEIF LOFLIN is a 18 y.o.  male with no medical history, who was admitted to [facility] on 09/18/2024 for Elevated CK [R74.8] Right shoulder pain [M25.511] Inadequate pain control [R52] Acute pain of right shoulder [M25.511] Traumatic rhabdomyolysis, initial encounter [T79.6XXA]  Rhabdomyolysis, acute traumatic, Recent weight training. CK 6339, now 7914. Agree with high rate IVF. Renal function remains normal at this time. Patient encouraged to remain hydrated.  Will continue to monitor.       LOS: 0 Jose Corvin 12/22/20252:29 PM     [1] No Known Allergies  "

## 2024-09-19 NOTE — Plan of Care (Signed)
   Problem: Education: Goal: Knowledge of General Education information will improve Description Including pain rating scale, medication(s)/side effects and non-pharmacologic comfort measures Outcome: Progressing

## 2024-09-19 NOTE — Progress Notes (Signed)
 " Progress Note   Patient: Albert Jimenez FMW:[id number] DOB: 02-01-2006 DOA: 09/18/2024     0 DOS: the patient was seen and examined on 09/19/2024   Brief hospital course: Albert Jimenez is a 18 y.o. year old male without any significant medical history presenting to the ED with right shoulder pain.  He states he was working out yesterday and had overextension when he was performing chest flies.  He initially did not have any significant pain but woke up in pain at 3 AM in the morning.  Describes the pain as sharp, severe.  States he has significant pain with any movement in the right arm.  He states range of motion is limited by pain.  He denies any fevers or chills any URI or other infectious symptoms.     On arrival to the ED patient was noted to be HDS stable.  Lab work and imaging obtained.  CBC without leukocytosis or anemia.  BMP unremarkable.  CK elevated at 6339 and patient received IV fluid and repeat CK downtrending close 6047.  UA pending.  Patient with significant shoulder pain, given this and elevated CK, TRH contacted for admission.     Assessment and Plan:  Acute traumatic rhabdomyolysis Right shoulder pain Patient admitted to the hospital for traumatic rhabdomyolysis following a shoulder injury Total CK levels show an upward trend 6047 >> 7914 Increase IV fluids to 141ml/hr Right shoulder MRI showed findings most compatible with grade 1 strain versus low-grade partial tear of the supraspinatus myotendinous junction with mild supraspinatus tendinosis. No discrete labral tear. No acute osseous abnormality. Consult orthopedic surgery          Subjective: Continues to have right shoulder pain  Physical Exam: Vitals:   09/18/24 2140 09/19/24 0427 09/19/24 0432 09/19/24 0733  BP: 134/70  117/76 116/66  Pulse: 79  60 86  Resp: 19  18 20   Temp: 98.4 F (36.9 C)  97.8 F (36.6 C) 98.7 F (37.1 C)  TempSrc: Oral  Oral Oral  SpO2: 97%  100% 100%  Weight: 91 kg 91  kg    Height: 6' 2 (1.88 m)     Physical Exam Vitals and nursing note reviewed.  Constitutional:      Appearance: Normal appearance.  HENT:     Head: Normocephalic.     Nose: Nose normal.     Mouth/Throat:     Mouth: Mucous membranes are moist.  Cardiovascular:     Rate and Rhythm: Normal rate and regular rhythm.  Pulmonary:     Effort: Pulmonary effort is normal.     Breath sounds: Normal breath sounds.  Abdominal:     General: Abdomen is flat. Bowel sounds are normal.     Palpations: Abdomen is soft.  Musculoskeletal:     Cervical back: Normal range of motion and neck supple.     Comments: Decreased range of motion right shoulder  Skin:    General: Skin is warm and dry.  Neurological:     Mental Status: He is alert and oriented to person, place, and time.       Data Reviewed: Total CK 7914  Family Communication: Plan of care discussed with patient in detail.  He verbalizes understanding and agrees with the plan  Disposition: Status is: Inpatient Remains inpatient appropriate because: Continues to require IV fluids for rhabdomyolysis.  Planned Discharge Destination: Home    Time spent: 35 minutes  Author: Aimee Somerset, MD 09/19/2024 10:41 AM  For on call review  [email or web address]  "

## 2024-09-19 NOTE — Progress Notes (Signed)
 PT Cancellation Note  Patient Details Name: Albert Jimenez MRN: [id number] DOB: 2006-02-02   Cancelled Treatment:    Reason Eval/Treat Not Completed: PT screened, no needs identified, will sign off PT orders received, chart reviewed. Pt received in bed, reporting he's independent with mobility, just limited in R shoulder ROM 2/2 weakness & pain. Shoulder MRI pending. No acute PT needs identified at this time. PT to complete current orders, please re-consult if new needs arise.  Educated pt on possibility of OPPT f/u if deemed appropriate by MD after MRI results.  Albert Jimenez, PT, DPT 09/19/2024, 9:30 AM    Albert Jimenez 09/19/2024, 9:29 AM

## 2024-09-20 DIAGNOSIS — T796XXD Traumatic ischemia of muscle, subsequent encounter: Secondary | ICD-10-CM | POA: Diagnosis not present

## 2024-09-20 LAB — BASIC METABOLIC PANEL WITH GFR
Anion gap: 9 (ref 5–15)
BUN: 7 mg/dL (ref 6–20)
CO2: 26 mmol/L (ref 22–32)
Calcium: 9 mg/dL (ref 8.9–10.3)
Chloride: 105 mmol/L (ref 98–111)
Creatinine, Ser: 0.76 mg/dL (ref 0.61–1.24)
GFR, Estimated: >60 mL/min
Glucose, Bld: 92 mg/dL (ref 70–99)
Potassium: 3.8 mmol/L (ref 3.5–5.1)
Sodium: 140 mmol/L (ref 135–145)

## 2024-09-20 LAB — CK
Total CK: 7978 U/L — ABNORMAL HIGH (ref 49–397)
Total CK: 10470 U/L — ABNORMAL HIGH (ref 49–397)

## 2024-09-20 LAB — GLUCOSE, CAPILLARY: Glucose-Capillary: 84 mg/dL (ref 70–99)

## 2024-09-20 MED ORDER — LACTATED RINGERS IV SOLN: INTRAVENOUS | Status: DC

## 2024-09-20 MED ORDER — STERILE WATER FOR INJECTION IV SOLN
INTRAVENOUS | Status: DC
  Filled 2024-09-20: qty 150
  Filled 2024-09-20: qty 1000
  Filled 2024-09-20: qty 150
  Filled 2024-09-20: qty 1000
  Filled 2024-09-20: qty 150
  Filled 2024-09-20: qty 1000
  Filled 2024-09-20: qty 150

## 2024-09-20 NOTE — TOC CM/SW Note (Signed)
 Transition of Care [facility]) - Inpatient Brief Assessment   Patient Details  Name: Albert Jimenez MRN: [id number] Date of Birth: July 31, 2006  Transition of Care [facility]) CM/SW Contact:    Nathanael CHRISTELLA Ring, RN Phone Number: 09/20/2024, 11:44 AM   Clinical Narrative:   Transition of Care [facility]) Screening Note   Patient Details  Name: Albert Jimenez Date of Birth: 03/08/2006   Transition of Care [facility]) CM/SW Contact:    Nathanael CHRISTELLA Ring, RN Phone Number: 09/20/2024, 11:44 AM    Transition of Care Department [facility]) has reviewed patient and no TOC needs have been identified at this time. If new patient transition needs arise, please place a TOC consult.    Transition of Care Asessment: Insurance and Status: Insurance coverage has been reviewed Patient has primary care physician: Yes Home environment has been reviewed: home Prior level of function:: independent Prior/Current Home Services: No current home services Social Drivers of Health Review: SDOH reviewed no interventions necessary Readmission risk has been reviewed: Yes Transition of care needs: no transition of care needs at this time

## 2024-09-20 NOTE — Plan of Care (Signed)

## 2024-09-20 NOTE — Progress Notes (Signed)
 " Progress Note   Patient: Albert Jimenez FMW:[id number] DOB: 02-18-2006 DOA: 09/18/2024     1 DOS: the patient was seen and examined on 09/20/2024   Brief hospital course:  Albert Jimenez is a 18 y.o. year old male without any significant medical history presenting to the ED with right shoulder pain.  He states he was working out yesterday and had overextension when he was performing chest flies.  He initially did not have any significant pain but woke up in pain at 3 AM in the morning.  Describes the pain as sharp, severe.  States he has significant pain with any movement in the right arm.  He states range of motion is limited by pain.  He denies any fevers or chills any URI or other infectious symptoms.     On arrival to the ED patient was noted to be HDS stable.  Lab work and imaging obtained.  CBC without leukocytosis or anemia.  BMP unremarkable.  CK elevated at 6339 and patient received IV fluid and repeat CK downtrending close 6047.  UA pending.  Patient with significant shoulder pain, given this and elevated CK, TRH contacted for admission    Assessment and Plan:   Acute traumatic rhabdomyolysis Right shoulder pain Patient admitted to the hospital for traumatic rhabdomyolysis following a shoulder injury Total CK levels continue to show an upward trend 6047 >> 7914 >> 7978 IV fluids increased to 200 mL an hour Right shoulder MRI showed findings most compatible with grade 1 strain versus low-grade partial tear of the supraspinatus myotendinous junction with mild supraspinatus tendinosis. No discrete labral tear. No acute osseous abnormality. Appreciate orthopedic surgery input, recommends weightbearing as tolerated to right upper extremity.  Recommends outpatient physical therapy Pain control           Subjective: No new complaints.  Improved range of motion involving the right shoulder  Physical Exam: Vitals:   09/19/24 2131 09/20/24 0431 09/20/24 0433 09/20/24 0805  BP:  (!) 144/71  (!) 119/58 (!) 104/54  Pulse: 74  63 84  Resp: 18  18 17   Temp: 97.7 F (36.5 C)  98 F (36.7 C) 98.1 F (36.7 C)  TempSrc: Oral  Oral Oral  SpO2: 98%  98% 99%  Weight:  91.4 kg    Height:       Vitals and nursing note reviewed.  Constitutional:      Appearance: Normal appearance.  HENT:     Head: Normocephalic.     Nose: Nose normal.     Mouth/Throat:     Mouth: Mucous membranes are moist.  Cardiovascular:     Rate and Rhythm: Normal rate and regular rhythm.  Pulmonary:     Effort: Pulmonary effort is normal.     Breath sounds: Normal breath sounds.  Abdominal:     General: Abdomen is flat. Bowel sounds are normal.     Palpations: Abdomen is soft.  Musculoskeletal:     Cervical back: Normal range of motion and neck supple.     Comments: Decreased range of motion right shoulder  Skin:    General: Skin is warm and dry.  Neurological:     Mental Status: He is alert and oriented to person, place, and time.         Data Reviewed: Total [id number] Labs reviewed  Family Communication: Plan of care discussed with patient in detail.  He verbalizes understanding and agrees with the plan  Disposition: Status is: Inpatient Remains inpatient appropriate because:  Remains on IV fluid hydration  Planned Discharge Destination: Home    Time spent: 40 minutes  Author: Aimee Somerset, MD 09/20/2024 1:10 PM  For on call review [email or web address].  "

## 2024-09-20 NOTE — Progress Notes (Signed)
 " [facility]  ROUNDING NOTE   Subjective:   Patient seen resting quietly in bed Alert and oriented Less pain in right shoulder Able to raise arm above head  CK 7978  Objective:  Vital signs in last 24 hours:  Temp:  [97.7 F (36.5 C)-98.1 F (36.7 C)] 98.1 F (36.7 C) (12/23 0805) Pulse Rate:  [63-84] 84 (12/23 0805) Resp:  [15-18] 17 (12/23 0805) BP: (104-144)/(54-71) 104/54 (12/23 0805) SpO2:  [98 %-99 %] 99 % (12/23 0805) Weight:  [91.4 kg] 91.4 kg (12/23 0431)  Weight change: 0.681 kg Filed Weights   09/18/24 2140 09/19/24 0427 09/20/24 0431  Weight: 91 kg 91 kg 91.4 kg    Intake/Output: I/O last 3 completed shifts: In: 2321.8 [P.O.:240; I.V.:1081.8; IV Piggyback:1000] Out: -    Intake/Output this shift:  Total I/O In: 3 [I.V.:3] Out: -   Physical Exam: General: NAD  Head: Normocephalic, atraumatic. Moist oral mucosal membranes  Eyes: Anicteric  Lungs:  Clear to auscultation  Heart: Regular rate and rhythm  Abdomen:  Soft, nontender  Extremities:  No peripheral edema.  Neurologic: Awake, alert, conversant  Skin: Warm,dry, no rash       Basic Metabolic Panel: Recent Labs  Lab 09/18/24 0921 09/18/24 1305 09/19/24 0704 09/20/24 0528  NA 136  --  140 140  K 3.8  --  3.9 3.8  CL 103  --  106 105  CO2 23  --  24 26  GLUCOSE 95  --  89 92  BUN 17  --  8 7  CREATININE 0.69  --  0.63 0.76  CALCIUM 9.1  --  8.7* 9.0  MG  --  2.0  --   --     Liver Function Tests: No results for input(s): AST, ALT, ALKPHOS, BILITOT, PROT, ALBUMIN in the last 168 hours. No results for input(s): LIPASE, AMYLASE in the last 168 hours. No results for input(s): AMMONIA in the last 168 hours.  CBC: Recent Labs  Lab 09/18/24 0921 09/19/24 0704  WBC 10.4 6.2  NEUTROABS 8.6*  --   HGB 14.9 13.9  HCT 42.7 39.6  MCV 85.7 86.3  PLT 259 260    Cardiac Enzymes: Recent Labs  Lab 09/18/24 0921 09/18/24 1305 09/19/24 0704  09/20/24 0528  CKTOTAL 6,339* 6,047* 7,914* 7,978*    BNP: Invalid input(s): POCBNP  CBG: Recent Labs  Lab 09/20/24 0829  GLUCAP 84    Microbiology: No results found for this or any previous visit.  Coagulation Studies: No results for input(s): LABPROT, INR in the last 72 hours.  Urinalysis: Recent Labs    09/18/24 1923  COLORURINE YELLOW*  LABSPEC 1.017  PHURINE 6.0  GLUCOSEU NEGATIVE  HGBUR NEGATIVE  BILIRUBINUR NEGATIVE  KETONESUR NEGATIVE  PROTEINUR NEGATIVE  NITRITE NEGATIVE  LEUKOCYTESUR NEGATIVE      Imaging: MR SHOULDER RIGHT W WO CONTRAST Result Date: 09/19/2024 CLINICAL DATA:  Right shoulder pain after lifting heavy weights while working out. EXAM: MRI OF THE RIGHT SHOULDER WITHOUT AND WITH CONTRAST TECHNIQUE: Multiplanar, multisequence MR imaging of the RIGHT shoulder was performed before and after the administration of intravenous contrast. CONTRAST:  9mL GADAVIST  GADOBUTROL  1 MMOL/ML IV SOLN COMPARISON:  Right shoulder radiographs dated 09/18/2024. FINDINGS: Rotator cuff: Mild supraspinatus tendinosis. Infraspinatus tendon is intact. Teres minor tendon is intact. Subscapularis tendon is intact. Muscles: There is increased T2 hyperintense signal extending along the myotendinous junction of the supraspinatus tendon from the level of the rotator cuff insertion proximally with  surrounding feathery edema of the supraspinatus muscle. No muscular atrophy. The remainder of the muscles demonstrate normal signal intensity. Biceps Long Head: Intact. Acromioclavicular Joint: No significant arthropathy of the acromioclavicular joint. No subacromial/subdeltoid bursal fluid. Glenohumeral Joint: No joint effusion. No chondral defect. Labrum: Grossly intact, but evaluation is limited by lack of intraarticular fluid/contrast. Bones: No fracture or dislocation. No marrow replacing lesion. Other: No fluid collection. IMPRESSION: 1. Findings most compatible with grade 1 strain  versus low-grade partial tear of the supraspinatus myotendinous junction with mild supraspinatus tendinosis. 2. No discrete labral tear. 3. No acute osseous abnormality. Electronically Signed   By: Harrietta Sherry M.D.   On: 09/19/2024 10:39     Medications:    lactated ringers  200 mL/hr at 09/20/24 1007    acetaminophen   1,000 mg Oral Q6H   heparin   5,000 Units Subcutaneous Q8H   methocarbamol   500 mg Oral QID   sodium chloride  flush  3 mL Intravenous Q12H   ondansetron  **OR** ondansetron  (ZOFRAN ) IV, oxyCODONE , senna-docusate  Assessment/ Plan:  [name] is a 18 y.o.  male with no medical history, who was admitted to [facility] on 09/18/2024 for Elevated CK [R74.8] Right shoulder pain [M25.511] Inadequate pain control [R52] Acute pain of right shoulder [M25.511] Traumatic rhabdomyolysis, initial encounter [T79.6XXA]   Rhabdomyolysis, acute traumatic, Recent weight training. CK increased minimally today. Patient continues to void well, tolerating increased IVF and oral hydration. Will recheck CK level this afternoon.     LOS: 1 Tracey Hermance 12/23/202510:10 AM   "

## 2024-09-21 DIAGNOSIS — T796XXD Traumatic ischemia of muscle, subsequent encounter: Secondary | ICD-10-CM | POA: Diagnosis not present

## 2024-09-21 DIAGNOSIS — M6282 Rhabdomyolysis: Secondary | ICD-10-CM | POA: Insufficient documentation

## 2024-09-21 LAB — GLUCOSE, CAPILLARY: Glucose-Capillary: 81 mg/dL (ref 70–99)

## 2024-09-21 LAB — COMPREHENSIVE METABOLIC PANEL WITH GFR
ALT: 92 U/L — ABNORMAL HIGH (ref 0–44)
AST: 218 U/L — ABNORMAL HIGH (ref 15–41)
Albumin: 4.3 g/dL (ref 3.5–5.0)
Alkaline Phosphatase: 106 U/L (ref 38–126)
Anion gap: 8 (ref 5–15)
BUN: 7 mg/dL (ref 6–20)
CO2: 32 mmol/L (ref 22–32)
Calcium: 9.5 mg/dL (ref 8.9–10.3)
Chloride: 101 mmol/L (ref 98–111)
Creatinine, Ser: 0.77 mg/dL (ref 0.61–1.24)
GFR, Estimated: >60 mL/min
Glucose, Bld: 84 mg/dL (ref 70–99)
Potassium: 4.2 mmol/L (ref 3.5–5.1)
Sodium: 141 mmol/L (ref 135–145)
Total Bilirubin: 0.4 mg/dL (ref 0.0–1.2)
Total Protein: 7 g/dL (ref 6.5–8.1)

## 2024-09-21 LAB — CK
Total CK: 12150 U/L — ABNORMAL HIGH (ref 49–397)
Total CK: 18114 U/L — ABNORMAL HIGH (ref 49–397)

## 2024-09-21 NOTE — Progress Notes (Signed)
 " Progress Note   Patient: Albert Jimenez FMW:[id number] DOB: 21-Apr-2006 DOA: 09/18/2024     2 DOS: the patient was seen and examined on 09/21/2024   Brief hospital course:  ORTON CAPELL is a 18 y.o. year old male without any significant medical history presenting to the ED with right shoulder pain.  He states he was working out yesterday and had overextension when he was performing chest flies.  He initially did not have any significant pain but woke up in pain at 3 AM in the morning.  Describes the pain as sharp, severe.  States he has significant pain with any movement in the right arm.  He states range of motion is limited by pain.  He denies any fevers or chills any URI or other infectious symptoms.     On arrival to the ED patient was noted to be HDS stable.  Lab work and imaging obtained.  CBC without leukocytosis or anemia.  BMP unremarkable.  CK elevated at 6339 and patient received IV fluid and repeat CK downtrending close 6047.  UA pending.  Patient with significant shoulder pain, given this and elevated CK, TRH contacted for admission    Assessment and Plan:  Acute traumatic rhabdomyolysis Right shoulder pain Patient admitted to the hospital for traumatic rhabdomyolysis following a shoulder injury Total CK levels continue to show an upward trend 6047 >> 7914 >> 7978 >> 12150 Patient has been on IV normal saline at 200 cc an hour and then a bicarbonate infusion without any significant improvement in his total CK levels. Discussed with nephrology who recommends to continue gentle IV fluid hydration as the extent of muscle injury is unknown.  Will continue to trend CK levels.  Right shoulder MRI showed findings most compatible with grade 1 strain versus low-grade partial tear of the supraspinatus myotendinous junction with mild supraspinatus tendinosis. No discrete labral tear. No acute osseous abnormality. Appreciate orthopedic surgery input, recommends weightbearing as tolerated  to right upper extremity.  Recommends outpatient physical therapy Pain control    Transaminitis Likely related to rhabdomyolysis will Continue to monitor       Subjective: No new complaints  Physical Exam: Vitals:   09/20/24 2017 09/21/24 0427 09/21/24 0500 09/21/24 0804  BP: (!) 145/77 (!) 106/58  130/82  Pulse: 66 (!) 50  68  Resp: 17 18  16   Temp: (!) 97.5 F (36.4 C) 97.8 F (36.6 C)  98 F (36.7 C)  TempSrc:      SpO2: 100% 99%  98%  Weight:   93.6 kg   Height:       Constitutional:      Appearance: Normal appearance.  HENT:     Head: Normocephalic.     Nose: Nose normal.     Mouth/Throat:     Mouth: Mucous membranes are moist.  Cardiovascular:     Rate and Rhythm: Normal rate and regular rhythm.  Pulmonary:     Effort: Pulmonary effort is normal.     Breath sounds: Normal breath sounds.  Abdominal:     General: Abdomen is flat. Bowel sounds are normal.     Palpations: Abdomen is soft.  Musculoskeletal:     Cervical back: Normal range of motion and neck supple.     Comments: Decreased range of motion right shoulder  Skin:    General: Skin is warm and dry.  Neurological:     Mental Status: He is alert and oriented to person, place, and time.  Data Reviewed: Total CK 12150  Labs reviewed  Family Communication: Plan of care discussed with patient in detail.  He verbalizes understanding and agrees with the plan  Disposition: Status is: Inpatient Remains inpatient appropriate because: Continues to require IV fluid  Planned Discharge Destination: Home    Time spent:  minutes  Author: Aimee Somerset, MD 09/21/2024 11:25 AM  For on call review [email or web address].  "

## 2024-09-21 NOTE — Progress Notes (Addendum)
 Per report by dayshift RN pt can take shower but no order place. MD Mansy made aware.  Update 2012: See new order.  Update 2034: Pt taking shower, order is in.   Update 2136: CK level was not drawn scheduled at 2011. Laboratory staff made aware and states will send someone to draw the lab.   Update 2210: Laboratory staff came and draw pt CK.   Update 0223: CK level was at 18,114. MD Mansy made aware.  Update 0308: See new orders.

## 2024-09-21 NOTE — Plan of Care (Signed)

## 2024-09-21 NOTE — Progress Notes (Signed)
 " [facility]  ROUNDING NOTE   Subjective:   Patient sitting up in chair Continues to tolerate meals   CK 12150  Objective:  Vital signs in last 24 hours:  Temp:  [97.5 F (36.4 C)-98.5 F (36.9 C)] 98 F (36.7 C) (12/24 0804) Pulse Rate:  [50-68] 68 (12/24 0804) Resp:  [16-18] 16 (12/24 0804) BP: (106-145)/(58-82) 130/82 (12/24 0804) SpO2:  [98 %-100 %] 98 % (12/24 0804) Weight:  [93.6 kg] 93.6 kg (12/24 0500)  Weight change: 2.2 kg Filed Weights   09/19/24 0427 09/20/24 0431 09/21/24 0500  Weight: 91 kg 91.4 kg 93.6 kg    Intake/Output: I/O last 3 completed shifts: In: 3 [I.V.:3] Out: -    Intake/Output this shift:  Total I/O In: 3 [I.V.:3] Out: -   Physical Exam: General: NAD  Head: Normocephalic, atraumatic. Moist oral mucosal membranes  Eyes: Anicteric  Lungs:  Clear to auscultation, room air  Heart: Regular rate and rhythm  Abdomen:  Soft, nontender  Extremities:  No peripheral edema.  Neurologic: Awake, alert, conversant  Skin: Warm,dry, no rash       Basic Metabolic Panel: Recent Labs  Lab 09/18/24 0921 09/18/24 1305 09/19/24 0704 09/20/24 0528 09/21/24 0811  NA 136  --  140 140 141  K 3.8  --  3.9 3.8 4.2  CL 103  --  106 105 101  CO2 23  --  24 26 32  GLUCOSE 95  --  89 92 84  BUN 17  --  8 7 7   CREATININE 0.69  --  0.63 0.76 0.77  CALCIUM 9.1  --  8.7* 9.0 9.5  MG  --  2.0  --   --   --     Liver Function Tests: Recent Labs  Lab 09/21/24 0811  AST 218*  ALT 92*  ALKPHOS 106  BILITOT 0.4  PROT 7.0  ALBUMIN 4.3   No results for input(s): LIPASE, AMYLASE in the last 168 hours. No results for input(s): AMMONIA in the last 168 hours.  CBC: Recent Labs  Lab 09/18/24 0921 09/19/24 0704  WBC 10.4 6.2  NEUTROABS 8.6*  --   HGB 14.9 13.9  HCT 42.7 39.6  MCV 85.7 86.3  PLT 259 260    Cardiac Enzymes: Recent Labs  Lab 09/18/24 1305 09/19/24 0704 09/20/24 0528 09/20/24 1505 09/21/24 0811  CKTOTAL  6,047* 7,914* 7,978* 10,470* 12,150*    BNP: Invalid input(s): POCBNP  CBG: Recent Labs  Lab 09/20/24 0829 09/21/24 0806  GLUCAP 84 81    Microbiology: No results found for this or any previous visit.  Coagulation Studies: No results for input(s): LABPROT, INR in the last 72 hours.  Urinalysis: Recent Labs    09/18/24 1923  COLORURINE YELLOW*  LABSPEC 1.017  PHURINE 6.0  GLUCOSEU NEGATIVE  HGBUR NEGATIVE  BILIRUBINUR NEGATIVE  KETONESUR NEGATIVE  PROTEINUR NEGATIVE  NITRITE NEGATIVE  LEUKOCYTESUR NEGATIVE      Imaging: No results found.    Medications:    sodium bicarbonate  150 mEq in sterile water  1,150 mL infusion 75 mL/hr at 09/21/24 0848    acetaminophen   1,000 mg Oral Q6H   heparin   5,000 Units Subcutaneous Q8H   methocarbamol   500 mg Oral QID   sodium chloride  flush  3 mL Intravenous Q12H   ondansetron  **OR** ondansetron  (ZOFRAN ) IV, oxyCODONE , senna-docusate  Assessment/ Plan:  [name] is a 18 y.o.  male with no medical history, who was admitted to [facility] on  09/18/2024 for Elevated CK [R74.8] Right shoulder pain [M25.511] Inadequate pain control [R52] Acute pain of right shoulder [M25.511] Traumatic rhabdomyolysis, initial encounter [T79.6XXA]   Rhabdomyolysis, acute traumatic, Recent weight training. CK continues to rise, 12k today. Discussed with patient and mother (speakerphone) that we would not feel comfortable discharging when CK continues to rise. Will continue to monitor and await peak.      LOS: 2 Albert Jimenez 12/24/202512:25 PM   "

## 2024-09-21 NOTE — Plan of Care (Signed)
  Problem: Education: Goal: Knowledge of General Education information will improve Description: Including pain rating scale, medication(s)/side effects and non-pharmacologic comfort measures Outcome: Progressing   Problem: Clinical Measurements: Goal: Respiratory complications will improve Outcome: Progressing   Problem: Clinical Measurements: Goal: Cardiovascular complication will be avoided Outcome: Progressing   Problem: Activity: Goal: Risk for activity intolerance will decrease Outcome: Progressing   Problem: Elimination: Goal: Will not experience complications related to bowel motility Outcome: Progressing   Problem: Elimination: Goal: Will not experience complications related to urinary retention Outcome: Progressing   Problem: Pain Managment: Goal: General experience of comfort will improve and/or be controlled Outcome: Progressing

## 2024-09-22 LAB — HEPATIC FUNCTION PANEL
ALT: 181 U/L — ABNORMAL HIGH (ref 0–44)
AST: 356 U/L — ABNORMAL HIGH (ref 15–41)
Albumin: 4.4 g/dL (ref 3.5–5.0)
Alkaline Phosphatase: 113 U/L (ref 38–126)
Bilirubin, Direct: 0.1 mg/dL (ref 0.0–0.2)
Indirect Bilirubin: 0.2 mg/dL — ABNORMAL LOW (ref 0.3–0.9)
Total Bilirubin: 0.3 mg/dL (ref 0.0–1.2)
Total Protein: 7.2 g/dL (ref 6.5–8.1)

## 2024-09-22 LAB — HEPATITIS C ANTIBODY: HCV Ab: NONREACTIVE

## 2024-09-22 LAB — GLUCOSE, CAPILLARY: Glucose-Capillary: 91 mg/dL (ref 70–99)

## 2024-09-22 LAB — HEPATITIS A ANTIBODY, IGM: Hep A IgM: NONREACTIVE

## 2024-09-22 LAB — C-REACTIVE PROTEIN: CRP: <0.5 mg/dL

## 2024-09-22 LAB — SEDIMENTATION RATE: Sed Rate: 5 mm/h (ref 0–15)

## 2024-09-22 LAB — HEPATITIS B SURFACE ANTIGEN: Hepatitis B Surface Ag: NONREACTIVE

## 2024-09-22 LAB — HEPATITIS B CORE ANTIBODY, IGM: Hep B C IgM: NONREACTIVE

## 2024-09-22 LAB — CK: Total CK: 19081 U/L — ABNORMAL HIGH (ref 49–397)

## 2024-09-22 LAB — LACTATE DEHYDROGENASE: LDH: 685 U/L — ABNORMAL HIGH (ref 105–235)

## 2024-09-22 MED ORDER — SODIUM CHLORIDE 0.9 % IV BOLUS
1000.0000 mL | Freq: Once | INTRAVENOUS | Status: AC
  Administered 2024-09-22: 1000 mL via INTRAVENOUS

## 2024-09-22 MED ORDER — SODIUM BICARBONATE 8.4 % IV SOLN
INTRAVENOUS | Status: DC
  Filled 2024-09-22 (×2): qty 1000
  Filled 2024-09-22 (×3): qty 150
  Filled 2024-09-22 (×2): qty 1000
  Filled 2024-09-22: qty 150
  Filled 2024-09-22: qty 1000

## 2024-09-22 MED ORDER — SODIUM CHLORIDE 0.9 % IV SOLN: INTRAVENOUS | Status: DC

## 2024-09-22 NOTE — Progress Notes (Signed)
 [facility]  - [place] at [facility]   PATIENT NAME: Albert Jimenez    MR#:  [id number]  DATE OF BIRTH:  06-08-2006  SUBJECTIVE:  no family at bedside during my evaluation. Patient overall feels better. Shoulder pain improved. Good urine output. Patient wondering when he can go home. Discussed about CK levels    VITALS:  Blood pressure (!) 130/97, pulse 66, temperature 98.3 F (36.8 C), temperature source Oral, resp. rate 16, height 6' 2 (1.88 m), weight 90.5 kg, SpO2 100%.  PHYSICAL EXAMINATION:   GENERAL:  18 y.o.-year-old patient with no acute distress.  LUNGS: Normal breath sounds bilaterally, no wheezing CARDIOVASCULAR: S1, S2 normal. No murmur   ABDOMEN: Soft, nontender, nondistended. Bowel sounds present.  EXTREMITIES: No  edema b/l.    NEUROLOGIC: nonfocal  patient is alert and awake SKIN: No obvious rash, lesion, or ulcer.   LABORATORY PANEL:  CBC Recent Labs  Lab 09/19/24 0704  WBC 6.2  HGB 13.9  HCT 39.6  PLT 260    Chemistries  Recent Labs  Lab 09/18/24 1305 09/19/24 0704 09/21/24 0811  NA  --    < > 141  K  --    < > 4.2  CL  --    < > 101  CO2  --    < > 32  GLUCOSE  --    < > 84  BUN  --    < > 7  CREATININE  --    < > 0.77  CALCIUM  --    < > 9.5  MG 2.0  --   --   AST  --   --  218*  ALT  --   --  92*  ALKPHOS  --   --  106  BILITOT  --   --  0.4   < > = values in this interval not displayed.    Assessment and Plan Albert Jimenez is a 18 y.o. year old male without any significant medical history presenting to the ED with right shoulder pain. He states he was working out yesterday and had overextension when he was lifting weights for exercise  Acute traumatic rhabdomyolysis Right shoulder pain --Patient admitted to the hospital for traumatic rhabdomyolysis following a shoulder injury --Total CK levels continue to show an upward trend 6047 >> 7914 >> 7978 >> 12150>>18K>>19K --Patient has been on IV normal saline and on  bicarbonate infusion without much  improvement in his total CK levels. --Discussed with nephrology who has ordered autoimmune labs and aldolase --Right shoulder MRI showed findings most compatible with grade 1 strain versus low-grade partial tear of the supraspinatus myotendinous junction with mild supraspinatus tendinosis. No discrete labral tear. No acute osseous abnormality. --Appreciate orthopedic surgery input, recommends weightbearing as tolerated to right upper extremity.  Recommends outpatient physical therapy --Pain control   Transaminitis Likely related to rhabdomyolysis        Procedures: Family communication :none today Consults : nephrology CODE STATUS: full DVT Prophylaxis :ambulation Level of care: Med-Surg Status is: Inpatient ongoing management for elevated CK for acute rhabdomyolysis    TOTAL TIME TAKING CARE OF THIS PATIENT: 34 minutes.  >50% time spent on counselling and coordination of care  Note: This dictation was prepared with Dragon dictation along with smaller phrase technology. Any transcriptional errors that result from this process are unintentional.  Leita Blanch M.D    [facility]   CC: Primary care physician; Practice, [facility]

## 2024-09-22 NOTE — Progress Notes (Signed)
 " [facility]  ROUNDING NOTE   Subjective:   Patient sitting up in chair Continues to tolerate meals Muscle pain has reportedly improved.   Multiple family members at bedside. Mother on the phone.    CK 19081 (18114) (12150)  Sodium bicarbonate  infusion at 157mL/hr  Objective:  Vital signs in last 24 hours:  Temp:  [97.7 F (36.5 C)-98.3 F (36.8 C)] 98.3 F (36.8 C) (12/25 0717) Pulse Rate:  [55-81] 66 (12/25 0717) Resp:  [16] 16 (12/24 1543) BP: (98-142)/(49-97) 130/97 (12/25 0717) SpO2:  [98 %-100 %] 100 % (12/25 0717) Weight:  [90.5 kg] 90.5 kg (12/25 0405)  Weight change: -3.1 kg Filed Weights   09/20/24 0431 09/21/24 0500 09/22/24 0405  Weight: 91.4 kg 93.6 kg 90.5 kg    Intake/Output: I/O last 3 completed shifts: In: 1779.8 [P.O.:240; I.V.:539.8; IV Piggyback:1000] Out: -    Intake/Output this shift:  Total I/O In: 3 [I.V.:3] Out: 700 [Urine:700]  Physical Exam: General: NAD, laying in bed  Head: Normocephalic, atraumatic. Moist oral mucosal membranes  Eyes: Anicteric  Lungs:  Clear to auscultation, room air  Heart: Regular rate and rhythm  Abdomen:  Soft, nontender  Extremities:  No peripheral edema.  Neurologic: Awake, alert, conversant  Skin: Warm,dry, no rash       Basic Metabolic Panel: Recent Labs  Lab 09/18/24 0921 09/18/24 1305 09/19/24 0704 09/20/24 0528 09/21/24 0811  NA 136  --  140 140 141  K 3.8  --  3.9 3.8 4.2  CL 103  --  106 105 101  CO2 23  --  24 26 32  GLUCOSE 95  --  89 92 84  BUN 17  --  8 7 7   CREATININE 0.69  --  0.63 0.76 0.77  CALCIUM 9.1  --  8.7* 9.0 9.5  MG  --  2.0  --   --   --     Liver Function Tests: Recent Labs  Lab 09/21/24 0811  AST 218*  ALT 92*  ALKPHOS 106  BILITOT 0.4  PROT 7.0  ALBUMIN 4.3   No results for input(s): LIPASE, AMYLASE in the last 168 hours. No results for input(s): AMMONIA in the last 168 hours.  CBC: Recent Labs  Lab 09/18/24 0921  09/19/24 0704  WBC 10.4 6.2  NEUTROABS 8.6*  --   HGB 14.9 13.9  HCT 42.7 39.6  MCV 85.7 86.3  PLT 259 260    Cardiac Enzymes: Recent Labs  Lab 09/20/24 0528 09/20/24 1505 09/21/24 0811 09/21/24 2208 09/22/24 0944  CKTOTAL 7,978* 10,470* 12,150* 18,114* 19,081*    BNP: Invalid input(s): POCBNP  CBG: Recent Labs  Lab 09/20/24 0829 09/21/24 0806 09/22/24 0718  GLUCAP 84 81 91    Microbiology: No results found for this or any previous visit.  Coagulation Studies: No results for input(s): LABPROT, INR in the last 72 hours.  Urinalysis: No results for input(s): COLORURINE, LABSPEC, PHURINE, GLUCOSEU, HGBUR, BILIRUBINUR, KETONESUR, PROTEINUR, UROBILINOGEN, NITRITE, LEUKOCYTESUR in the last 72 hours.  Invalid input(s): APPERANCEUR     Imaging: No results found.    Medications:    sodium chloride  150 mL/hr at 09/22/24 0501   sodium bicarbonate  150 mEq in dextrose  5 % 1,150 mL infusion      acetaminophen   1,000 mg Oral Q6H   methocarbamol   500 mg Oral QID   sodium chloride  flush  3 mL Intravenous Q12H   ondansetron  **OR** ondansetron  (ZOFRAN ) IV, oxyCODONE , senna-docusate  Assessment/ Plan:  Mr.  Albert Jimenez NEEDS is a 18 y.o.  male with no medical history, who was admitted to [facility] on 09/18/2024 for Elevated CK [R74.8] Right shoulder pain [M25.511] Inadequate pain control [R52] Acute pain of right shoulder [M25.511] Traumatic rhabdomyolysis, initial encounter [T79.6XXA]   Rhabdomyolysis, acute traumatic, Recent weight training. CK continues to rise, Discussed with patient and mother (speakerphone) and multiple family members.  - due to increasing rise in CK levels, will do a more thorough work up: CRP, ESR, ANA, aldolase, LFTs, acute hepatitis screen, serum electrophoresis and TSH - Continue aggressive IV fluids - avoid extreme muscle movements - discussed role of muscle biopsy.     LOS: 3 Carlita Whitcomb 12/25/202511:28  AM   "

## 2024-09-22 NOTE — Plan of Care (Signed)
  Problem: Education: Goal: Knowledge of General Education information will improve Description: Including pain rating scale, medication(s)/side effects and non-pharmacologic comfort measures Outcome: Progressing   Problem: Health Behavior/Discharge Planning: Goal: Ability to manage health-related needs will improve Outcome: Progressing   Problem: Clinical Measurements: Goal: Ability to maintain clinical measurements within normal limits will improve Outcome: Progressing Goal: Will remain free from infection Outcome: Progressing Goal: Respiratory complications will improve Outcome: Progressing Goal: Cardiovascular complication will be avoided Outcome: Progressing   Problem: Activity: Goal: Risk for activity intolerance will decrease Outcome: Progressing   Problem: Nutrition: Goal: Adequate nutrition will be maintained Outcome: Progressing   Problem: Coping: Goal: Level of anxiety will decrease Outcome: Progressing   Problem: Elimination: Goal: Will not experience complications related to bowel motility Outcome: Progressing Goal: Will not experience complications related to urinary retention Outcome: Progressing   Problem: Pain Managment: Goal: General experience of comfort will improve and/or be controlled Outcome: Progressing   Problem: Safety: Goal: Ability to remain free from injury will improve Outcome: Progressing   Problem: Skin Integrity: Goal: Risk for impaired skin integrity will decrease Outcome: Progressing   Problem: Clinical Measurements: Goal: Diagnostic test results will improve Outcome: Not Progressing

## 2024-09-23 LAB — GLUCOSE, CAPILLARY: Glucose-Capillary: 99 mg/dL (ref 70–99)

## 2024-09-23 LAB — CK
Total CK: 13167 U/L — ABNORMAL HIGH (ref 49–397)
Total CK: 15348 U/L — ABNORMAL HIGH (ref 49–397)

## 2024-09-23 NOTE — Plan of Care (Signed)
 Albert Jimenez

## 2024-09-23 NOTE — Plan of Care (Signed)

## 2024-09-23 NOTE — Progress Notes (Signed)
 " [facility]  ROUNDING NOTE   Subjective:   Patient sitting up in chair Continues to tolerate meals Muscle pain has reportedly improved.   Multiple family members at bedside. Mother on the phone.    CK 13167 (19081) (18114) (12150)  Sodium bicarbonate  116mL/hr  Objective:  Vital signs in last 24 hours:  Temp:  [97.5 F (36.4 C)-99.3 F (37.4 C)] 98.2 F (36.8 C) (12/26 0741) Pulse Rate:  [58-78] 62 (12/26 0741) Resp:  [16-18] 16 (12/26 0741) BP: (123-149)/(51-76) 125/51 (12/26 0741) SpO2:  [98 %-100 %] 99 % (12/26 0741) Weight:  [90.5 kg] 90.5 kg (12/26 0448)  Weight change: 0 kg Filed Weights   09/21/24 0500 09/22/24 0405 09/23/24 0448  Weight: 93.6 kg 90.5 kg 90.5 kg    Intake/Output: I/O last 3 completed shifts: In: 2019.8 [P.O.:480; I.V.:539.8; IV Piggyback:1000] Out: 4725 [Urine:4725]   Intake/Output this shift:  Total I/O In: 2953.5 [I.V.:2953.5] Out: -   Physical Exam: General: NAD, laying in bed  Head: Normocephalic, atraumatic. Moist oral mucosal membranes  Eyes: Anicteric  Lungs:  Clear to auscultation, room air  Heart: Regular rate and rhythm  Abdomen:  Soft, nontender  Extremities:  No peripheral edema.  Neurologic: Awake, alert, conversant  Skin: Warm,dry, no rash       Basic Metabolic Panel: Recent Labs  Lab 09/18/24 0921 09/18/24 1305 09/19/24 0704 09/20/24 0528 09/21/24 0811  NA 136  --  140 140 141  K 3.8  --  3.9 3.8 4.2  CL 103  --  106 105 101  CO2 23  --  24 26 32  GLUCOSE 95  --  89 92 84  BUN 17  --  8 7 7   CREATININE 0.69  --  0.63 0.76 0.77  CALCIUM 9.1  --  8.7* 9.0 9.5  MG  --  2.0  --   --   --     Liver Function Tests: Recent Labs  Lab 09/21/24 0811 09/22/24 1153  AST 218* 356*  ALT 92* 181*  ALKPHOS 106 113  BILITOT 0.4 0.3  PROT 7.0 7.2  ALBUMIN 4.3 4.4   No results for input(s): LIPASE, AMYLASE in the last 168 hours. No results for input(s): AMMONIA in the last 168  hours.  CBC: Recent Labs  Lab 09/18/24 0921 09/19/24 0704  WBC 10.4 6.2  NEUTROABS 8.6*  --   HGB 14.9 13.9  HCT 42.7 39.6  MCV 85.7 86.3  PLT 259 260    Cardiac Enzymes: Recent Labs  Lab 09/20/24 1505 09/21/24 0811 09/21/24 2208 09/22/24 0944 09/23/24 0803  CKTOTAL 10,470* 12,150* 18,114* 19,081* 13,167*    BNP: Invalid input(s): POCBNP  CBG: Recent Labs  Lab 09/20/24 0829 09/21/24 0806 09/22/24 0718 09/23/24 0741  GLUCAP 84 81 91 99    Microbiology: No results found for this or any previous visit.  Coagulation Studies: No results for input(s): LABPROT, INR in the last 72 hours.  Urinalysis: No results for input(s): COLORURINE, LABSPEC, PHURINE, GLUCOSEU, HGBUR, BILIRUBINUR, KETONESUR, PROTEINUR, UROBILINOGEN, NITRITE, LEUKOCYTESUR in the last 72 hours.  Invalid input(s): APPERANCEUR     Imaging: No results found.    Medications:    sodium bicarbonate  150 mEq in dextrose  5 % 1,150 mL infusion 150 mL/hr at 09/23/24 0826    acetaminophen   1,000 mg Oral Q6H   methocarbamol   500 mg Oral QID   sodium chloride  flush  3 mL Intravenous Q12H   ondansetron  **OR** ondansetron  (ZOFRAN ) IV, oxyCODONE , senna-docusate  Assessment/ Plan:  [name] is a 18 y.o.  male with no medical history, who was admitted to [facility] on 09/18/2024 for Elevated CK [R74.8] Right shoulder pain [M25.511] Inadequate pain control [R52] Acute pain of right shoulder [M25.511] Traumatic rhabdomyolysis, initial encounter [T79.6XXA]   Rhabdomyolysis, acute traumatic, Recent weight training. CK continues to rise, Discussed with patient and mother (speakerphone) and multiple family members.  - due to increasing rise in CK levels, will do a more thorough work up:ESR, ANA, aldolase, LFTs, acute hepatitis screen, serum electrophoresis and TSH pending. CRP normal - CK has improved today, 13k.  - Will recheck level this afternoon and if downtrend  continues, cleared to discharge from renal stance.  - Patient encouraged to continue to hydrate and follow up with PCP next week.  - Not cleared to return to work during the weekend, until cleared by PCP    LOS: 4 Albert Jimenez 12/26/202510:15 AM   "

## 2024-09-24 DIAGNOSIS — R748 Abnormal levels of other serum enzymes: Secondary | ICD-10-CM

## 2024-09-24 DIAGNOSIS — E876 Hypokalemia: Secondary | ICD-10-CM | POA: Insufficient documentation

## 2024-09-24 DIAGNOSIS — R7989 Other specified abnormal findings of blood chemistry: Secondary | ICD-10-CM | POA: Diagnosis not present

## 2024-09-24 DIAGNOSIS — T796XXA Traumatic ischemia of muscle, initial encounter: Secondary | ICD-10-CM | POA: Diagnosis not present

## 2024-09-24 LAB — COMPREHENSIVE METABOLIC PANEL WITH GFR
ALT: 435 U/L — ABNORMAL HIGH (ref 0–44)
AST: 419 U/L — ABNORMAL HIGH (ref 15–41)
Albumin: 4.1 g/dL (ref 3.5–5.0)
Alkaline Phosphatase: 104 U/L (ref 38–126)
Anion gap: 11 (ref 5–15)
BUN: 9 mg/dL (ref 6–20)
CO2: 28 mmol/L (ref 22–32)
Calcium: 9.2 mg/dL (ref 8.9–10.3)
Chloride: 101 mmol/L (ref 98–111)
Creatinine, Ser: 0.72 mg/dL (ref 0.61–1.24)
GFR, Estimated: >60 mL/min
Glucose, Bld: 102 mg/dL — ABNORMAL HIGH (ref 70–99)
Potassium: 3.4 mmol/L — ABNORMAL LOW (ref 3.5–5.1)
Sodium: 139 mmol/L (ref 135–145)
Total Bilirubin: 0.2 mg/dL (ref 0.0–1.2)
Total Protein: 6.4 g/dL — ABNORMAL LOW (ref 6.5–8.1)

## 2024-09-24 LAB — ALDOLASE: Aldolase: <1.2 U/L — ABNORMAL LOW (ref 3.3–10.3)

## 2024-09-24 LAB — THYROID PANEL WITH TSH
Free Thyroxine Index: 1.9 (ref 1.2–4.9)
T3 Uptake Ratio: 31 % (ref 24–39)
T4, Total: 6.1 ug/dL (ref 4.5–12.0)
TSH: 0.761 u[IU]/mL (ref 0.450–4.500)

## 2024-09-24 LAB — CBC
HCT: 40.5 % (ref 39.0–52.0)
Hemoglobin: 14.2 g/dL (ref 13.0–17.0)
MCH: 30 pg (ref 26.0–34.0)
MCHC: 35.1 g/dL (ref 30.0–36.0)
MCV: 85.4 fL (ref 80.0–100.0)
Platelets: 283 K/uL (ref 150–400)
RBC: 4.74 MIL/uL (ref 4.22–5.81)
RDW: 12.3 % (ref 11.5–15.5)
WBC: 6.7 K/uL (ref 4.0–10.5)
nRBC: 0 % (ref 0.0–0.2)

## 2024-09-24 LAB — PROTIME-INR
INR: 1 (ref 0.8–1.2)
Prothrombin Time: 13.9 s (ref 11.4–15.2)

## 2024-09-24 LAB — ANA W/REFLEX: Anti Nuclear Antibody (ANA): NEGATIVE

## 2024-09-24 LAB — CK
Total CK: 12215 U/L — ABNORMAL HIGH (ref 49–397)
Total CK: 11577 U/L — ABNORMAL HIGH (ref 49–397)

## 2024-09-24 LAB — HEPATITIS B CORE ANTIBODY, TOTAL: HEP B CORE AB: NEGATIVE

## 2024-09-24 LAB — GLUCOSE, CAPILLARY: Glucose-Capillary: 87 mg/dL (ref 70–99)

## 2024-09-24 MED ORDER — POTASSIUM CHLORIDE CRYS ER 20 MEQ PO TBCR
40.0000 meq | EXTENDED_RELEASE_TABLET | Freq: Once | ORAL | Status: AC
  Administered 2024-09-24: 40 meq via ORAL
  Filled 2024-09-24: qty 2

## 2024-09-24 NOTE — Progress Notes (Signed)
 MD order received in [facility] to discharge pt home today; verbally reviewed AVS with pt, no questions voiced at this time; pt discharged to the [facility]

## 2024-09-24 NOTE — Progress Notes (Addendum)
 " [facility]  ROUNDING NOTE   Subjective:   Patient in bed feeling restless and eager to go back to work. Encouraged rest and hydration.  Family members at bedside  CK 12215 (13167) (19081) (81885) (87849)  IVF discontinued  Objective:  Vital signs in last 24 hours:  Temp:  [98 F (36.7 C)-99.3 F (37.4 C)] 98.2 F (36.8 C) (12/27 0932) Pulse Rate:  [50-93] 93 (12/27 0932) Resp:  [16-18] 18 (12/27 0932) BP: (118-141)/(59-81) 139/64 (12/27 0932) SpO2:  [94 %-99 %] 97 % (12/27 0932) Weight:  [90.1 kg] 90.1 kg (12/27 0440)  Weight change: -0.4 kg Filed Weights   09/22/24 0405 09/23/24 0448 09/24/24 0440  Weight: 90.5 kg 90.5 kg 90.1 kg    Intake/Output: I/O last 3 completed shifts: In: 2953.5 [I.V.:2953.5] Out: -    Intake/Output this shift:  Total I/O In: 3347.3 [I.V.:3347.3] Out: -   Physical Exam: General: NAD  Head: Normocephalic  Eyes: Anicteric  Lungs:  room air  Heart: Regular rate   Abdomen:  Soft, nontender  Extremities:  No peripheral edema.  Neurologic: Awake, alert, conversant  Skin: Warm,dry, no rash       Basic Metabolic Panel: Recent Labs  Lab 09/18/24 0921 09/18/24 1305 09/19/24 0704 09/20/24 0528 09/21/24 0811 09/24/24 0222  NA 136  --  140 140 141 139  K 3.8  --  3.9 3.8 4.2 3.4*  CL 103  --  106 105 101 101  CO2 23  --  24 26 32 28  GLUCOSE 95  --  89 92 84 102*  BUN 17  --  8 7 7 9   CREATININE 0.69  --  0.63 0.76 0.77 0.72  CALCIUM 9.1  --  8.7* 9.0 9.5 9.2  MG  --  2.0  --   --   --   --     Liver Function Tests: Recent Labs  Lab 09/21/24 0811 09/22/24 1153 09/24/24 0222  AST 218* 356* 419*  ALT 92* 181* 435*  ALKPHOS 106 113 104  BILITOT 0.4 0.3 0.2  PROT 7.0 7.2 6.4*  ALBUMIN 4.3 4.4 4.1   No results for input(s): LIPASE, AMYLASE in the last 168 hours. No results for input(s): AMMONIA in the last 168 hours.  CBC: Recent Labs  Lab 09/18/24 0921 09/19/24 0704 09/24/24 0222  WBC 10.4  6.2 6.7  NEUTROABS 8.6*  --   --   HGB 14.9 13.9 14.2  HCT 42.7 39.6 40.5  MCV 85.7 86.3 85.4  PLT 259 260 283    Cardiac Enzymes: Recent Labs  Lab 09/21/24 2208 09/22/24 0944 09/23/24 0803 09/23/24 1336 09/24/24 0223  CKTOTAL 18,114* 19,081* 13,167* 15,348* 12,215*    BNP: Invalid input(s): POCBNP  CBG: Recent Labs  Lab 09/20/24 0829 09/21/24 0806 09/22/24 0718 09/23/24 0741 09/24/24 0802  GLUCAP 84 81 91 99 87    Microbiology: No results found for this or any previous visit.  Coagulation Studies: Recent Labs    09/24/24 0748  LABPROT 13.9  INR 1.0    Urinalysis: No results for input(s): COLORURINE, LABSPEC, PHURINE, GLUCOSEU, HGBUR, BILIRUBINUR, KETONESUR, PROTEINUR, UROBILINOGEN, NITRITE, LEUKOCYTESUR in the last 72 hours.  Invalid input(s): APPERANCEUR     Imaging: No results found.    Medications:      acetaminophen   1,000 mg Oral Q6H   methocarbamol   500 mg Oral QID   sodium chloride  flush  3 mL Intravenous Q12H   ondansetron  **OR** ondansetron  (ZOFRAN ) IV, oxyCODONE , senna-docusate  Assessment/ Plan:  Albert Jimenez is a 18 y.o.  male with no medical history, who was admitted to [facility] on 09/18/2024 for Elevated CK [R74.8] Right shoulder pain [M25.511] Inadequate pain control [R52] Acute pain of right shoulder [M25.511] Traumatic rhabdomyolysis, initial encounter [T79.6XXA]   Rhabdomyolysis, acute traumatic, Recent weight training. CK continues to rise, Discussed with patient and mother (speakerphone) and multiple family members.   -LFTs elevated - CK continues to improve today, 12k. Will hold IVF and recheck lab this afternoon - Cleared to discharge from renal stance. Will set up follow-up with our office - Not cleared to return to work till after follow-up with our office -Continue to encourage hydration  2. Hypokalemia   -replaced by primary team     LOS: 5 Merlene Dante P Jezebel Pollet 12/27/202510:08  AM   "

## 2024-09-24 NOTE — Plan of Care (Signed)

## 2024-09-24 NOTE — Discharge Instructions (Signed)
 You were seen in the hospital for acute rhabdomyolysis because of injury to your muscles as well as right should injury.   1. See a provider within 2-3 days of discharge - that can be your PCP, urgent care, or nephrology 2. You will need labs rechecked including electrolytes, creatinine kinase levels, and liver enzymes 3. Avoid strenuous exercise until cleared by a medical provider  4. Okay to bear weight as tolerated for the right shoulder 5. Okay to do range of motion exercises for right shoulder as tolerated 6. Do not return to work until cleared by a provider 7. Avoid supplements, creatinine, and pre-workouts 8. Avoid tylenol  for now until your liver enzymes improve and are cleared by a provider 9. Make sure to stay hydrated by drinking water  at home  Return to the ED if you notice decreasing urine, dark urine, worsening weakness/swelling/pain, chest pain/palpitations, confusion, or persistent vomiting.

## 2024-09-24 NOTE — Discharge Summary (Addendum)
 " Physician Discharge Summary   Patient: Albert Jimenez MRN: [id number] DOB: 2006/03/29  Admit date:     09/18/2024  Discharge date: 09/24/2024  Discharge Physician: Duffy Al-Sultani   PCP: [facility]   Recommendations at discharge:   Follow up with PCP, walk-in clinic/urgent care, or nephrology within 5 days of discharge Repeat labs in 24-48 hours: CK levels, CMP (K, Cr, bicarb, AST, ALT, Tbili)  Return precautions: decreasing urine, dark urine, worsening weakness/swelling/pain, chest pain/palpitations, confusion, persistent vomiting Weightbearing as tolerated to the right upper extremity. Avoid tylenol  due to elevated LFTs until liver enzymes are rechecked and cleared by provider Return to work only after cleared by provider  The patient is a 18 year old male with no past medical history who presented to the ED on 09/18/2024 complaining of right shoulder pain while working out.  Patient reportedly started recently working out again and was lifting heavy weights, felt a sharp pull in his right shoulder but was not overly bothersome at the time.  However the pain eventually woke him up from sleep around 3 AM and he presented to the ED for evaluation.  Pain was primarily in the lateral aspect of the shoulder and did not radiate into the fingers, chest, or neck.  In the ED, he was hemodynamically stable.  CBC and BMP were unremarkable.  However the patient had elevated CK levels to 6339.  He was admitted for acute traumatic rhabdomyolysis.     Discharge Diagnoses: Principal Problem:   Right shoulder pain Active Problems:   Elevated CK   Rhabdomyolysis   Hypokalemia   Elevated LFTs  Hospital Course: #Acute traumatic rhabdomyolysis CK levels continued to trend up despite continuous high rate IV fluids.  Nephrology was consulted. IVFs were later changed to sodium bicarb drip.  Workup was expanded to include CRP ESR, ANA, aldolase, LFTs, acute hepatitis screen, and  serum electrophoresis, TSH.   LDH was noted to be elevated 685.  CRP and ESR WNL.  TSH WNL.  Acute hepatitis panel unremarkable. Ultimately, CK peaked at 19,081, then trending down.  At the time discharge, CK levels were 11,577. At the time discharge, serum electrophoresis, kappa/lambda light chains, ANA, and aldolase are pending.  AST and ALT were elevated to 419 and 435, respectively, which has been uptrending.  However, the patient's creatinine remained stable throughout the hospitalization and he maintained good urine output.  Can follow-up with PCP regarding these results within the next 5 days.  Also instructed patient to repeat labs within 24 to 48 hours, which can be done at urgent care if unable to get in with PCP.  Strict return precautions provided including decreasing urine, dark urine, worsening weakness/swelling/pain, chest pain/palpitations, confusion, or persistent vomiting.  #Right shoulder pain X-ray of right shoulder showed no acute fracture or dislocation.  MRI of right shoulder showed possible grade 1 strain versus low-grade partial tear of the supraspinatus myotendinous junction with mild supraspinatus tendinosis, no discrete labral tear, no acute osseous abnormality.  Was evaluated by orthopedic surgery, recommended weightbearing as tolerated, range of motion exercises tolerated, slow progression back to activity as tolerated shoulder pain improves, follow-up with outpatient physical therapy and orthopedic surgery.  Referrals were both placed at discharge.  At the time of discharge, the patient was hemodynamically stable with improved symptoms, clinically appropriate for discharge, and in no acute distress. The discharge plan, follow-up instructions, return precautions, and medication changes were reviewed in detail with the patient, his mom, and sisters. The patient verbalized understanding,  was in agreement with the plan, and had all questions answered prior to leaving the hospital.       Consultants: Nephrology/orthopedic surgery Procedures performed: None Disposition: Home Diet recommendation:  Diet Orders (From admission, onward)     Start     Ordered   09/18/24 1707  Diet regular Room service appropriate? Yes; Fluid consistency: Thin  Diet effective now       Question Answer Comment  Room service appropriate? Yes   Fluid consistency: Thin      09/18/24 1707            DISCHARGE MEDICATION: Allergies as of 09/24/2024   No Known Allergies      Medication List     STOP taking these medications    pantoprazole  40 MG tablet Commonly known as: Protonix    sucralfate  1 g tablet Commonly known as: Carafate         Follow-up Information     [facility].   Why: Hospital follow up Contact information: [address] [place] [id number] [phone]                 Discharge Exam: Fredricka Weights   09/22/24 0405 09/23/24 0448 09/24/24 0440  Weight: 90.5 kg 90.5 kg 90.1 kg   Blood pressure (!) 118/59, pulse (!) 50, temperature 98 F (36.7 C), temperature source Oral, resp. rate 18, height 6' 2 (1.88 m), weight 90.1 kg, SpO2 99%.   Gen: NAD, A&Ox3 HEENT: NCAT, EOMI Neck: Supple, no JVD, no LAD CV: RRR, no murmurs Resp: normal WOB, CTAB, no w/r/r Abd: Soft, NTND, no guarding, BS normoactive Ext: No LE edema, pulses 2+ b/l Skin: Warm, dry, no rashes/lesions Neuro: CN II-XII grossly intact, strength 5/5 b/l, sensation intact Psych: Calm, cooperative, appropriate affect   Condition at discharge: good  The results of significant diagnostics from this hospitalization (including imaging, microbiology, ancillary and laboratory) are listed below for reference.   Imaging Studies: MR SHOULDER RIGHT W WO CONTRAST Result Date: 09/19/2024 CLINICAL DATA:  Right shoulder pain after lifting heavy weights while working out. EXAM: MRI OF THE RIGHT SHOULDER WITHOUT AND WITH CONTRAST TECHNIQUE:  Multiplanar, multisequence MR imaging of the RIGHT shoulder was performed before and after the administration of intravenous contrast. CONTRAST:  9mL GADAVIST  GADOBUTROL  1 MMOL/ML IV SOLN COMPARISON:  Right shoulder radiographs dated 09/18/2024. FINDINGS: Rotator cuff: Mild supraspinatus tendinosis. Infraspinatus tendon is intact. Teres minor tendon is intact. Subscapularis tendon is intact. Muscles: There is increased T2 hyperintense signal extending along the myotendinous junction of the supraspinatus tendon from the level of the rotator cuff insertion proximally with surrounding feathery edema of the supraspinatus muscle. No muscular atrophy. The remainder of the muscles demonstrate normal signal intensity. Biceps Long Head: Intact. Acromioclavicular Joint: No significant arthropathy of the acromioclavicular joint. No subacromial/subdeltoid bursal fluid. Glenohumeral Joint: No joint effusion. No chondral defect. Labrum: Grossly intact, but evaluation is limited by lack of intraarticular fluid/contrast. Bones: No fracture or dislocation. No marrow replacing lesion. Other: No fluid collection. IMPRESSION: 1. Findings most compatible with grade 1 strain versus low-grade partial tear of the supraspinatus myotendinous junction with mild supraspinatus tendinosis. 2. No discrete labral tear. 3. No acute osseous abnormality. Electronically Signed   By: Harrietta Sherry M.D.   On: 09/19/2024 10:39   DG Shoulder Right Result Date: 09/18/2024 EXAM: 1 VIEW(S) XRAY OF THE RIGHT SHOULDER 09/18/2024 08:00:00 AM COMPARISON: None available. CLINICAL HISTORY: shoulder injury FINDINGS: BONES AND JOINTS: Glenohumeral joint is  normally aligned. No acute fracture. No malalignment. The [facility] joint is unremarkable. SOFT TISSUES: No abnormal calcifications. Visualized lung is unremarkable. IMPRESSION: 1. No acute fracture or dislocation. Electronically signed by: Waddell Calk MD 09/18/2024 08:12 AM EST RP Workstation: [id number]     Microbiology: No results found for this or any previous visit.  Labs: CBC: Recent Labs  Lab 09/18/24 0921 09/19/24 0704 09/24/24 0222  WBC 10.4 6.2 6.7  NEUTROABS 8.6*  --   --   HGB 14.9 13.9 14.2  HCT 42.7 39.6 40.5  MCV 85.7 86.3 85.4  PLT 259 260 283   Basic Metabolic Panel: Recent Labs  Lab 09/18/24 0921 09/18/24 1305 09/19/24 0704 09/20/24 0528 09/21/24 0811 09/24/24 0222  NA 136  --  140 140 141 139  K 3.8  --  3.9 3.8 4.2 3.4*  CL 103  --  106 105 101 101  CO2 23  --  24 26 32 28  GLUCOSE 95  --  89 92 84 102*  BUN 17  --  8 7 7 9   CREATININE 0.69  --  0.63 0.76 0.77 0.72  CALCIUM 9.1  --  8.7* 9.0 9.5 9.2  MG  --  2.0  --   --   --   --    Liver Function Tests: Recent Labs  Lab 09/21/24 0811 09/22/24 1153 09/24/24 0222  AST 218* 356* 419*  ALT 92* 181* 435*  ALKPHOS 106 113 104  BILITOT 0.4 0.3 0.2  PROT 7.0 7.2 6.4*  ALBUMIN 4.3 4.4 4.1   CBG: Recent Labs  Lab 09/20/24 0829 09/21/24 0806 09/22/24 0718 09/23/24 0741  GLUCAP 84 81 91 99    Discharge time spent: Time Coordinating Discharge: I spent a total of 35 minutes engaged in face-to-face discussion with the patient and/or caregivers regarding the patients care, assessment, plan, and discharge disposition. Over 50% of this time was dedicated to counseling the patient on the risks and benefits of treatment options and the discharge plan, as well as coordinating post-discharge care.   Signed: Shaneya Taketa Al-Sultani, MD [facility] 09/24/2024         "

## 2024-09-26 LAB — KAPPA/LAMBDA LIGHT CHAINS
Kappa free light chain: 9.8 mg/L (ref 3.3–19.4)
Kappa, lambda light chain ratio: 0.85 (ref 0.26–1.65)
Lambda free light chains: 11.5 mg/L (ref 5.7–26.3)

## 2024-09-26 LAB — PROTEIN ELECTROPHORESIS, SERUM
A/G Ratio: 1.3 (ref 0.7–1.7)
Albumin ELP: 3.7 g/dL (ref 2.9–4.4)
Alpha-1-Globulin: 0.2 g/dL (ref 0.0–0.4)
Alpha-2-Globulin: 0.7 g/dL (ref 0.4–1.0)
Beta Globulin: 1 g/dL (ref 0.7–1.3)
Gamma Globulin: 0.9 g/dL (ref 0.4–1.8)
Globulin, Total: 2.8 g/dL (ref 2.2–3.9)
Total Protein ELP: 6.5 g/dL (ref 6.0–8.5)
